# Patient Record
Sex: Female | Born: 1965 | Race: White | Hispanic: Yes | Marital: Married | Smoking: Never smoker
Health system: Southern US, Community
[De-identification: ages and names within clinical notes are randomized; demographics above are authoritative.]

## PROBLEM LIST (undated history)

## (undated) DIAGNOSIS — I959 Hypotension, unspecified: Secondary | ICD-10-CM

## (undated) DIAGNOSIS — R102 Pelvic and perineal pain: Secondary | ICD-10-CM

## (undated) DIAGNOSIS — R1084 Generalized abdominal pain: Secondary | ICD-10-CM

## (undated) DIAGNOSIS — N950 Postmenopausal bleeding: Secondary | ICD-10-CM

## (undated) DIAGNOSIS — G8929 Other chronic pain: Secondary | ICD-10-CM

## (undated) DIAGNOSIS — N3946 Mixed incontinence: Secondary | ICD-10-CM

## (undated) HISTORY — PX: COLOSTOMY: SHX63

## (undated) HISTORY — DX: Mixed incontinence: N39.46

## (undated) HISTORY — DX: Postmenopausal bleeding: N95.0

## (undated) HISTORY — PX: APPENDECTOMY: SHX54

## (undated) HISTORY — DX: Pelvic and perineal pain: R10.2

## (undated) HISTORY — DX: Other chronic pain: G89.29

## (undated) HISTORY — DX: Generalized abdominal pain: R10.84

---

## 2002-07-31 ENCOUNTER — Emergency Department (HOSPITAL_COMMUNITY): Admission: EM | Admit: 2002-07-31 | Discharge: 2002-08-01 | Payer: Self-pay | Admitting: Emergency Medicine

## 2003-11-16 ENCOUNTER — Other Ambulatory Visit: Admission: RE | Admit: 2003-11-16 | Discharge: 2003-11-16 | Payer: Self-pay | Admitting: Gynecology

## 2004-02-02 ENCOUNTER — Ambulatory Visit (HOSPITAL_COMMUNITY): Admission: RE | Admit: 2004-02-02 | Discharge: 2004-02-02 | Payer: Self-pay | Admitting: Gynecology

## 2004-03-23 ENCOUNTER — Ambulatory Visit (HOSPITAL_COMMUNITY): Admission: RE | Admit: 2004-03-23 | Discharge: 2004-03-23 | Payer: Self-pay | Admitting: Gynecology

## 2004-04-20 ENCOUNTER — Encounter (INDEPENDENT_AMBULATORY_CARE_PROVIDER_SITE_OTHER): Payer: Self-pay | Admitting: Specialist

## 2004-04-20 ENCOUNTER — Observation Stay (HOSPITAL_COMMUNITY): Admission: RE | Admit: 2004-04-20 | Discharge: 2004-04-21 | Payer: Self-pay | Admitting: Gynecology

## 2005-04-05 ENCOUNTER — Other Ambulatory Visit: Admission: RE | Admit: 2005-04-05 | Discharge: 2005-04-05 | Payer: Self-pay | Admitting: Gynecology

## 2006-09-18 ENCOUNTER — Other Ambulatory Visit: Admission: RE | Admit: 2006-09-18 | Discharge: 2006-09-18 | Payer: Self-pay | Admitting: Gynecology

## 2006-10-12 IMAGING — US US TRANSVAGINAL NON-OB
1 series · 13 of 25 positions shown · non-contrast
Comparison: none

CLINICAL DATA: Follow-up ovarian cyst.  Painful intercourse.  LMP 03/11/04.
ENDOVAGINAL PELVIC ULTRASOUND:
Multiple images of the uterus and adnexa were obtained using an endovaginal approaches.  Correlation is made with the previous exam on 02/02/04.
The uterus has a maximal sagittal length of 9.0 cm and an AP width of 4.1 cm.  The uterine myometrium demonstrates a posterior upper segment fibroid measuring 1.8 x 1.3 x 1.8 cm.  This has a small partial subserosal component.
The endometrial canal is trilayered with an AP width of 9.3 mm and would correlate with a periovulatory endometrial stripe and the patient?s LMP of 03/11/04.  
Both ovaries are seen with the right ovary today measuring 5.6 x 3.4 x 4.4 cm.  This contains a persist unilocular complex cyst measuring 3.6 x 3.1 x 3.6 cm.  This contains diffuse low-level echoes and demonstrates little interval change from the previous exam.  Findings are again most compatible with an endometrioma and lack of interval change over the six week period would correlate with this representing an endometrioma rather than a hemorrhagic cyst as we would have expected to see some interval change in appearance over this six week period of time.  
The left ovary measures 3.5 x 4.7 x 2.4 cm and contains a dominant follicle.  On today?s exam the 2 cm dominant follicle is located in a different position than the 2 cm cyst seen on the prior exam.  The change in location, as well as the size and periovulatory status would correlate with this representing a dominant follicle.  A small amount of fluid is identified within the endocervical canal.  No cul-de-sac or periovarian fluid is seen and no separate adnexal masses are noted.

[Series 1: us transvaginal non-ob · 0.19mm/px · 42 acquisitions, 13 frames shown]
[im 1/42]
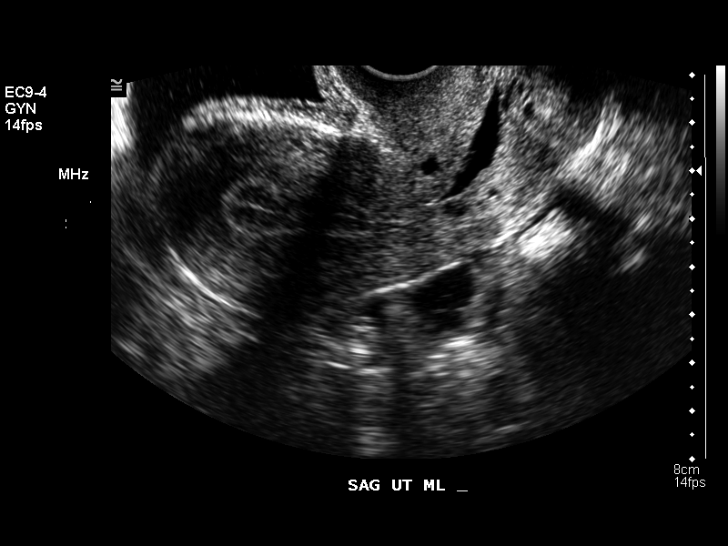
[im 4/42]
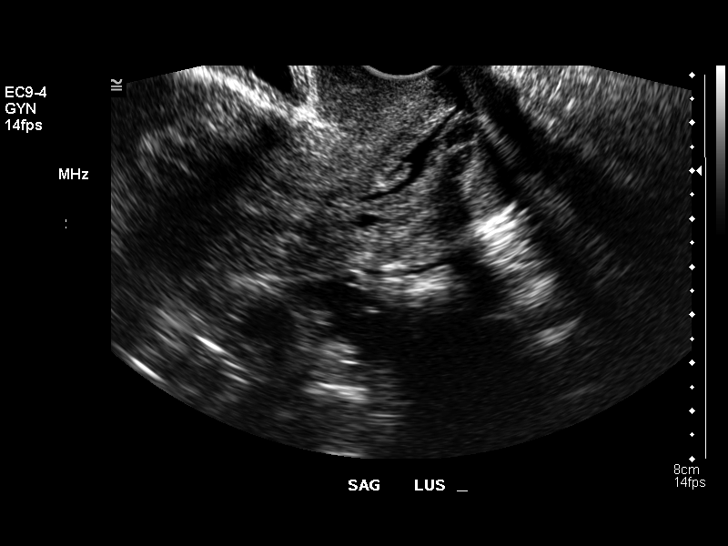
[im 7/42]
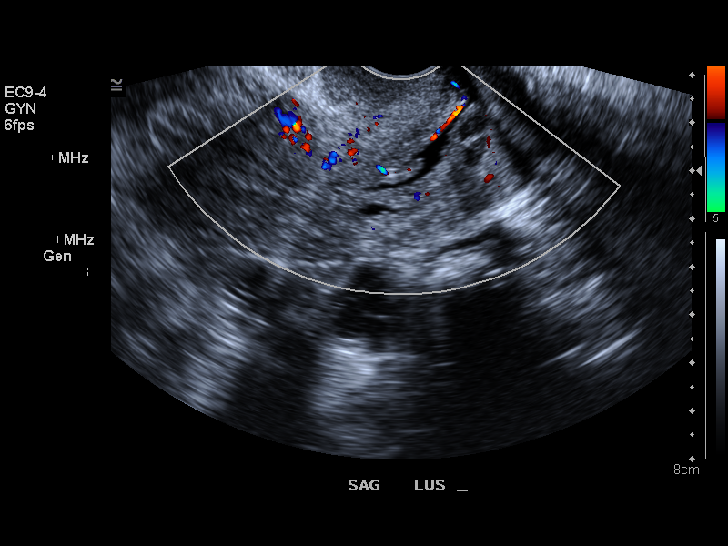
[im 11/42]
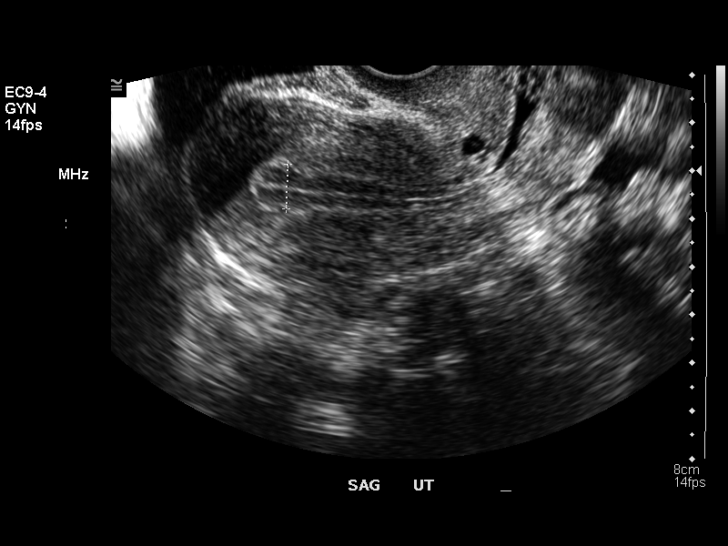
[im 14/42]
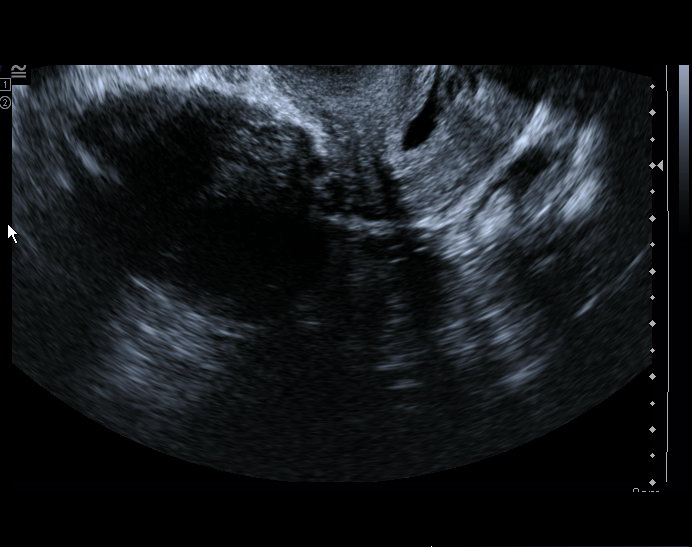
[im 18/42]
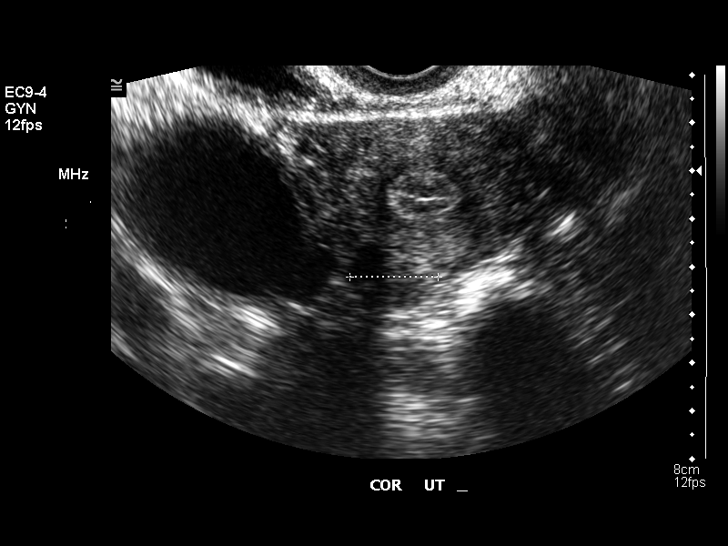
[im 21/42]
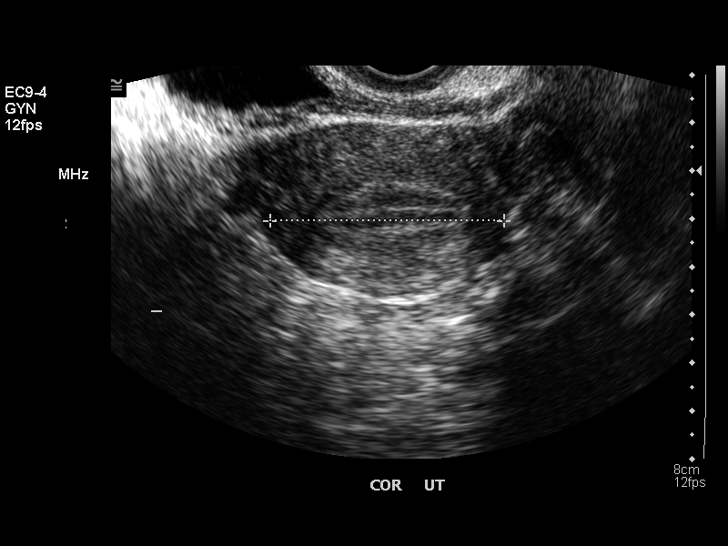
[im 24/42]
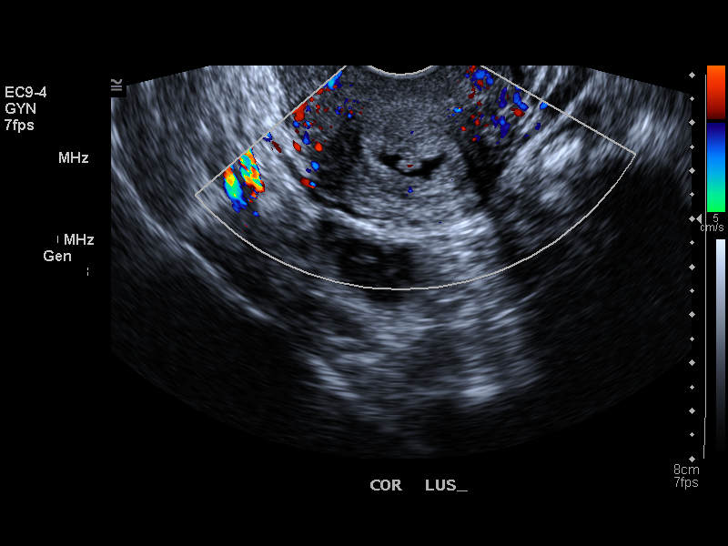
[im 28/42]
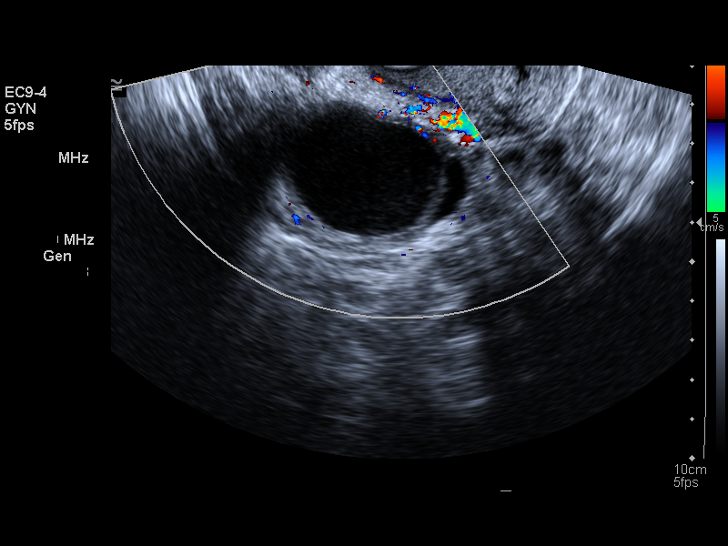
[im 31/42]
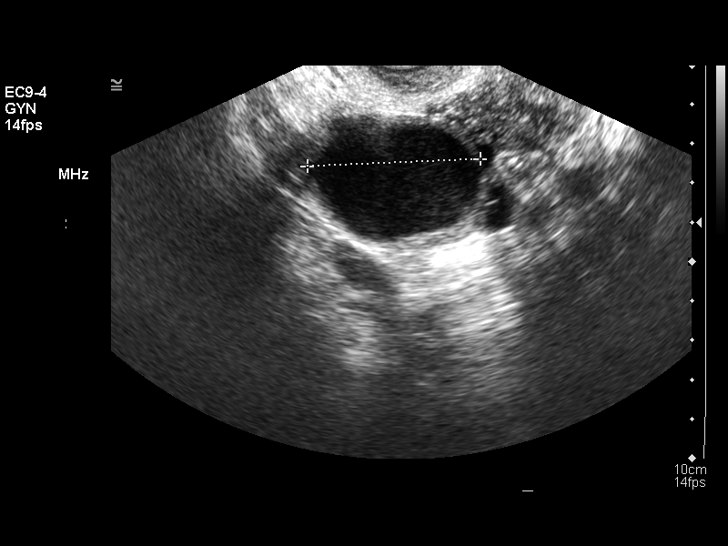
[im 35/42]
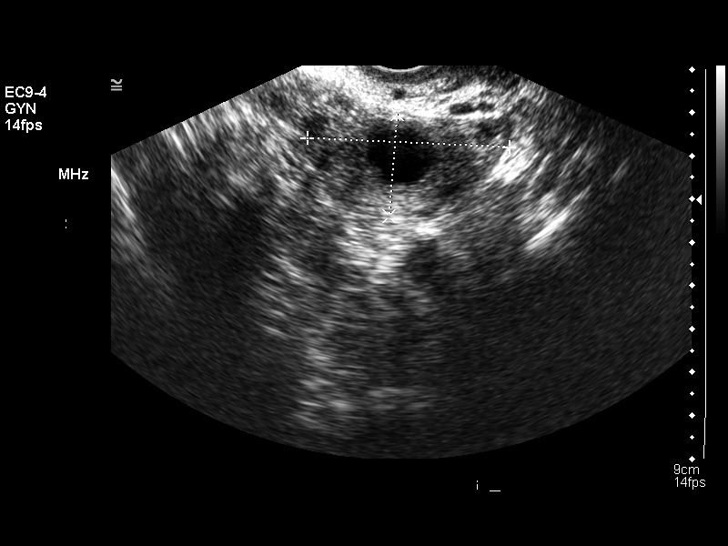
[im 38/42]
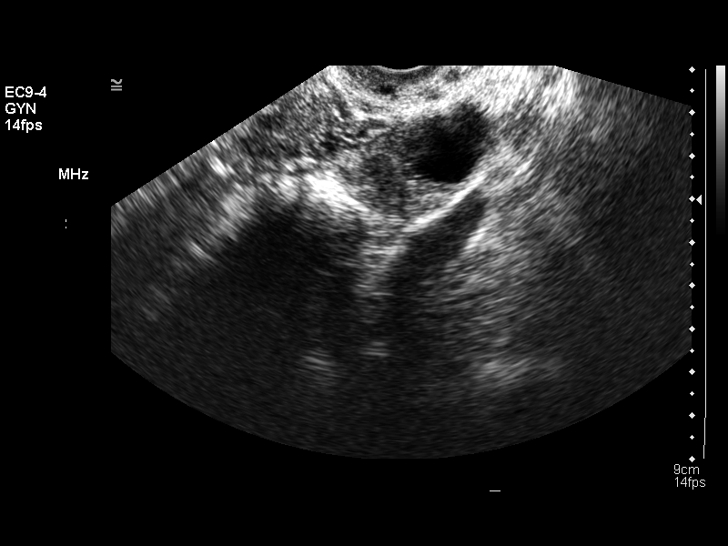
[im 42/42]
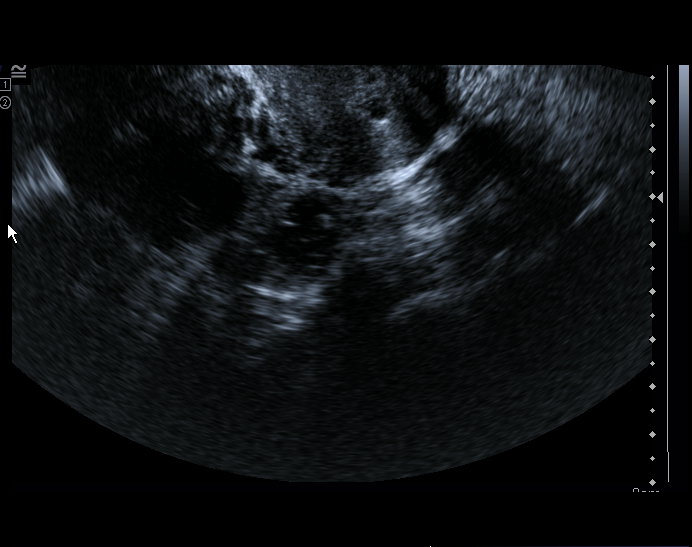

[13 of 25 positions shown; findings below may reference images not displayed]

IMPRESSION: Stable small uterine fibroids.  Stable right complex ovarian cyst compatible with an endometrioma.  Otherwise, normal periovulatory appearance to the uterus and left ovary.

## 2008-02-21 ENCOUNTER — Emergency Department (HOSPITAL_COMMUNITY): Admission: EM | Admit: 2008-02-21 | Discharge: 2008-02-21 | Payer: Self-pay | Admitting: Emergency Medicine

## 2008-02-29 ENCOUNTER — Emergency Department (HOSPITAL_COMMUNITY): Admission: EM | Admit: 2008-02-29 | Discharge: 2008-03-01 | Payer: Self-pay | Admitting: Emergency Medicine
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
## 2010-12-07 LAB — POCT I-STAT, CHEM 8
BUN: 17 mg/dL (ref 6–23)
Calcium, Ion: 0.97 mmol/L — ABNORMAL LOW (ref 1.12–1.32)
Chloride: 109 mEq/L (ref 96–112)
Creatinine, Ser: 0.8 mg/dL (ref 0.4–1.2)
Glucose, Bld: 110 mg/dL — ABNORMAL HIGH (ref 70–99)
HCT: 37 % (ref 36.0–46.0)
Hemoglobin: 12.6 g/dL (ref 12.0–15.0)
Potassium: 3.7 mEq/L (ref 3.5–5.1)
Sodium: 139 mEq/L (ref 135–145)
TCO2: 20 mmol/L (ref 0–100)

## 2010-12-07 LAB — DIFFERENTIAL
Basophils Absolute: 0 10*3/uL (ref 0.0–0.1)
Basophils Relative: 0 % (ref 0–1)
Eosinophils Absolute: 0 10*3/uL (ref 0.0–0.7)
Eosinophils Relative: 0 % (ref 0–5)
Lymphocytes Relative: 4 % — ABNORMAL LOW (ref 12–46)
Lymphs Abs: 0.4 10*3/uL — ABNORMAL LOW (ref 0.7–4.0)
Monocytes Absolute: 0.6 10*3/uL (ref 0.1–1.0)
Monocytes Relative: 6 % (ref 3–12)
Neutro Abs: 9.6 10*3/uL — ABNORMAL HIGH (ref 1.7–7.7)
Neutrophils Relative %: 90 % — ABNORMAL HIGH (ref 43–77)

## 2010-12-07 LAB — HEPATIC FUNCTION PANEL
ALT: 16 U/L (ref 0–35)
AST: 26 U/L (ref 0–37)
Albumin: 3.8 g/dL (ref 3.5–5.2)
Alkaline Phosphatase: 51 U/L (ref 39–117)
Bilirubin, Direct: 0.2 mg/dL (ref 0.0–0.3)
Indirect Bilirubin: 0.5 mg/dL (ref 0.3–0.9)
Total Bilirubin: 0.7 mg/dL (ref 0.3–1.2)
Total Protein: 6.6 g/dL (ref 6.0–8.3)

## 2010-12-07 LAB — CBC
HCT: 35.4 % — ABNORMAL LOW (ref 36.0–46.0)
Hemoglobin: 12.1 g/dL (ref 12.0–15.0)
MCHC: 34.1 g/dL (ref 30.0–36.0)
MCV: 86.3 fL (ref 78.0–100.0)
Platelets: 232 10*3/uL (ref 150–400)
RBC: 4.11 MIL/uL (ref 3.87–5.11)
RDW: 12.8 % (ref 11.5–15.5)
WBC: 10.7 10*3/uL — ABNORMAL HIGH (ref 4.0–10.5)

## 2010-12-07 LAB — URINE CULTURE

## 2010-12-07 LAB — URINALYSIS, ROUTINE W REFLEX MICROSCOPIC
Bilirubin Urine: NEGATIVE
Glucose, UA: NEGATIVE mg/dL
Hgb urine dipstick: NEGATIVE
Ketones, ur: >80 mg/dL — AB
Nitrite: NEGATIVE
Protein, ur: 30 mg/dL — AB
Specific Gravity, Urine: 1.031 — ABNORMAL HIGH (ref 1.005–1.030)
Urobilinogen, UA: 0.2 mg/dL (ref 0.0–1.0)
pH: 6.5 (ref 5.0–8.0)

## 2010-12-07 LAB — URINE MICROSCOPIC-ADD ON

## 2010-12-07 LAB — POCT PREGNANCY, URINE: Preg Test, Ur: NEGATIVE

## 2010-12-07 LAB — LIPASE, BLOOD: Lipase: 27 U/L (ref 11–59)

## 2012-11-11 ENCOUNTER — Other Ambulatory Visit: Payer: Self-pay | Admitting: Gastroenterology

## 2012-11-11 DIAGNOSIS — R109 Unspecified abdominal pain: Secondary | ICD-10-CM

## 2012-11-17 ENCOUNTER — Ambulatory Visit
Admission: RE | Admit: 2012-11-17 | Discharge: 2012-11-17 | Disposition: A | Discharge status: Home / Self Care | Payer: BC Managed Care – PPO | Source: Ambulatory Visit | Attending: Gastroenterology | Admitting: Gastroenterology

## 2012-11-17 DIAGNOSIS — R109 Unspecified abdominal pain: Secondary | ICD-10-CM

## 2013-08-09 ENCOUNTER — Emergency Department (HOSPITAL_COMMUNITY): Payer: BC Managed Care – PPO

## 2013-08-09 ENCOUNTER — Emergency Department (HOSPITAL_COMMUNITY)
Admission: EM | Admit: 2013-08-09 | Discharge: 2013-08-09 | Disposition: A | Discharge status: Home / Self Care | Payer: BC Managed Care – PPO | Attending: Emergency Medicine | Admitting: Emergency Medicine

## 2013-08-09 ENCOUNTER — Encounter (HOSPITAL_COMMUNITY): Payer: Self-pay | Admitting: Emergency Medicine

## 2013-08-09 DIAGNOSIS — R296 Repeated falls: Secondary | ICD-10-CM | POA: Insufficient documentation

## 2013-08-09 DIAGNOSIS — Z8679 Personal history of other diseases of the circulatory system: Secondary | ICD-10-CM | POA: Insufficient documentation

## 2013-08-09 DIAGNOSIS — S91309A Unspecified open wound, unspecified foot, initial encounter: Secondary | ICD-10-CM | POA: Insufficient documentation

## 2013-08-09 DIAGNOSIS — Y929 Unspecified place or not applicable: Secondary | ICD-10-CM | POA: Insufficient documentation

## 2013-08-09 DIAGNOSIS — S9030XA Contusion of unspecified foot, initial encounter: Secondary | ICD-10-CM | POA: Insufficient documentation

## 2013-08-09 DIAGNOSIS — S9032XA Contusion of left foot, initial encounter: Secondary | ICD-10-CM

## 2013-08-09 DIAGNOSIS — IMO0002 Reserved for concepts with insufficient information to code with codable children: Secondary | ICD-10-CM | POA: Insufficient documentation

## 2013-08-09 DIAGNOSIS — S90812A Abrasion, left foot, initial encounter: Secondary | ICD-10-CM

## 2013-08-09 DIAGNOSIS — S90122A Contusion of left lesser toe(s) without damage to nail, initial encounter: Secondary | ICD-10-CM

## 2013-08-09 DIAGNOSIS — Y9389 Activity, other specified: Secondary | ICD-10-CM | POA: Insufficient documentation

## 2013-08-09 HISTORY — DX: Hypotension, unspecified: I95.9

## 2013-08-09 MED ORDER — HYDROCODONE-ACETAMINOPHEN 5-325 MG PO TABS: 1.0000 | ORAL_TABLET | Freq: Four times a day (QID) | ORAL | Status: DC | PRN

## 2013-08-09 MED ORDER — BACITRACIN 500 UNIT/GM EX OINT
1.0000 "application " | TOPICAL_OINTMENT | Freq: Once | CUTANEOUS | Status: AC
  Administered 2013-08-09: 1 via TOPICAL
  Filled 2013-08-09: qty 0.9

## 2013-08-09 MED ORDER — IBUPROFEN 600 MG PO TABS: 600.0000 mg | ORAL_TABLET | Freq: Four times a day (QID) | ORAL | Status: DC | PRN

## 2013-08-09 MED ORDER — OXYCODONE-ACETAMINOPHEN 5-325 MG PO TABS
1.0000 | ORAL_TABLET | Freq: Once | ORAL | Status: AC
  Administered 2013-08-09: 1 via ORAL
  Filled 2013-08-09: qty 1

## 2013-08-09 NOTE — ED Notes (Signed)
Pt. tripped and fell forward this evening , reports pain at both knees , no LOC / alert and oriented / ambulatory , presents with abrasions at left plantar foot .

## 2013-08-09 NOTE — ED Provider Notes (Signed)
CSN: [id number]     Arrival date & time 08/09/13  1948 History  This chart was scribed for non-physician practitioner working with Rolland PorterMark James, MD, by Modena JanskyAlbert Thayil, ED Scribe. This patient was seen in room TR10C/TR10C and the patient's care was started at 8:39 PM   Chief Complaint  Patient presents with  . Fall   Patient is a [age] y.o. female presenting with fall. The history is provided by the patient. No language interpreter was used.  Fall This is a new problem. The current episode started 6 to 12 hours ago. The problem has not changed since onset.The symptoms are aggravated by walking. Nothing relieves the symptoms. She has tried nothing for the symptoms.   HPI Comments: Lynn Shields is a 48 y.o. female who presents to the Emergency Department complaining of a fall that occurred a few hours ago. She reports that her toe got caught on brick step. She fell forward and landed on both knees. She reports pain in both of her knees and abrasions at left plantar foot. She denies LOC or nausea. Up to date on tetanus.   Past Medical History  Diagnosis Date  . Hypotension    Past Surgical History  Procedure Laterality Date  . Colostomy    . Appendectomy     No family history on file. History  Substance Use Topics  . Smoking status: Never Smoker   . Smokeless tobacco: Not on file  . Alcohol Use: No   OB History   Grav Para Term Preterm Abortions TAB SAB Ect Mult Living                 Review of Systems  Gastrointestinal: Negative for nausea.  Musculoskeletal: Positive for arthralgias (both knees).       Abrasions to left great toe and ball of foot. Left ankle pain. Bilateral knee abrasions.  Neurological: Negative for syncope.    Allergies  Gentamycin  Home Medications   Prior to Admission medications   Medication Sig Start Date End Date Taking? Authorizing Provider  HYDROcodone-acetaminophen (NORCO) 5-325 MG per tablet Take 1 tablet by mouth every 6 (six) hours as needed  for moderate pain. 08/09/13   Alaney Witter Orlene OchM Gitty Osterlund, NP  ibuprofen (ADVIL,MOTRIN) 600 MG tablet Take 1 tablet (600 mg total) by mouth every 6 (six) hours as needed. 08/09/13   [place]ington Krotz Orlene OchM Laurice Kimmons, NP   BP 111/71  Pulse 103  Temp(Src) 98.6 F (37 C) (Oral)  Resp 14  SpO2 98%  LMP 07/26/2013 Physical Exam  Nursing note and vitals reviewed. Constitutional: She is oriented to person, place, and time. She appears well-developed and well-nourished.  HENT:  Head: Normocephalic and atraumatic.  Eyes: Conjunctivae and EOM are normal.  Neck: Normal range of motion. Neck supple.  Cardiovascular: Normal rate.   Pulmonary/Chest: Effort normal.  Musculoskeletal: Normal range of motion.       Right knee: She exhibits normal range of motion, no swelling, no ecchymosis, no deformity, no erythema and normal alignment. Tenderness found.       Left knee: She exhibits normal range of motion, no swelling, no ecchymosis, no erythema and normal patellar mobility. Tenderness found.       Legs:      Left foot: She exhibits swelling. She exhibits no deformity.       Feet:  Left ankle on lateral axis is swollen and TTP. Pain with dorsiflexion and plantar flexion. Pain with eversion. Pulses strong, adequate circulation, good touch sensation. Avulsion laceration to  tip of great toe and the ball of the foot. No active bleeding.   Neurological: She is alert and oriented to person, place, and time. No cranial nerve deficit.  Skin: Skin is warm and dry.  Abrasions to knees and left foot.  Psychiatric: She has a normal mood and affect. Her behavior is normal.    ED Course  Procedures (including critical care time) DIAGNOSTIC STUDIES: Oxygen Saturation is 98% on RA, normal by my interpretation.    COORDINATION OF CARE: 8:43 PM- Pt advised of plan for treatment which includes medication and radiology and pt agrees.  Labs ReviewImaging Review Dg Ankle Complete Left  08/09/2013   CLINICAL DATA:  Pain post trauma  EXAM: LEFT ANKLE  COMPLETE - 3+ VIEW  COMPARISON:  None.  FINDINGS: Frontal, oblique, and lateral views were obtained. There is no fracture or effusion. Ankle mortise appears intact.  IMPRESSION: No fracture.  Mortise intact.   Electronically Signed   By: Bretta Bang M.D.   On: 08/09/2013 21:45   Dg Foot Complete Left  08/09/2013   CLINICAL DATA:  Anterior foot pain after a fall today.  EXAM: LEFT FOOT - COMPLETE 3+ VIEW  COMPARISON:  None.  FINDINGS: Mild hallux valgus deformity with degenerative changes at the first metatarsal-phalangeal joint. Slight widening of the joint space between the medial and middle cuneiform bones could indicate ligamentous injury. No acute fracture is demonstrated. No significant soft tissue swelling.  IMPRESSION: Slight widening of the intertarsal space between the medial and middle cuneiform bones could indicate ligamentous injury or normal variation. No displaced fractures identified. Mild hallux valgus.   Electronically Signed   By: Burman Nieves M.D.   On: 08/09/2013 21:45     MDM  48 y.o. female with contusions, abrasions and superficial lacerations to the left foot. Contusions and abrasions bilateral knees. Wounds cleaned, bacitracin ointment, buddy tap of toes, left, ace wrap, post-op shoe. She will elevate, ice and take the pain medication as directed.  She will follow up with ortho if symptoms persist. I have reviewed this patient's vital signs, nurses notes, appropriate labs and imaging.  I have discussed findings and plan of care with the patient and she agrees with plan.    Medication List         HYDROcodone-acetaminophen 5-325 MG per tablet  Commonly known as:  NORCO  Take 1 tablet by mouth every 6 (six) hours as needed for moderate pain.     ibuprofen 600 MG tablet  Commonly known as:  ADVIL,MOTRIN  Take 1 tablet (600 mg total) by mouth every 6 (six) hours as needed.       I personally performed the services described in this documentation, which was scribed  in my presence. The recorded information has been reviewed and is accurate.     [facility] Orlene Och, NP 08/09/13 2300

## 2013-08-09 NOTE — Discharge Instructions (Signed)
Clean the abrasions with warm water and antibacterial soap. Apply neosporin ointment and dressing. Wear the ace wrap and shoe for comfort. Return for any problems.   Abrasion An abrasion is a cut or scrape of the skin. Abrasions do not extend through all layers of the skin and most heal within 10 days. It is important to care for your abrasion properly to prevent infection. CAUSES  Most abrasions are caused by falling on, or gliding across, the ground or other surface. When your skin rubs on something, the outer and inner layer of skin rubs off, causing an abrasion. DIAGNOSIS  Your caregiver will be able to diagnose an abrasion during a physical exam.  TREATMENT  Your treatment depends on how large and deep the abrasion is. Generally, your abrasion will be cleaned with water and a mild soap to remove any dirt or debris. An antibiotic ointment may be put over the abrasion to prevent an infection. A bandage (dressing) may be wrapped around the abrasion to keep it from getting dirty.  You may need a tetanus shot if:  You cannot remember when you had your last tetanus shot.  You have never had a tetanus shot.  The injury broke your skin. If you get a tetanus shot, your arm may swell, get red, and feel warm to the touch. This is common and not a problem. If you need a tetanus shot and you choose not to have one, there is a rare chance of getting tetanus. Sickness from tetanus can be serious.  HOME CARE INSTRUCTIONS   If a dressing was applied, change it at least once a day or as directed by your caregiver. If the bandage sticks, soak it off with warm water.   Wash the area with water and a mild soap to remove all the ointment 2 times a day. Rinse off the soap and pat the area dry with a clean towel.   Reapply any ointment as directed by your caregiver. This will help prevent infection and keep the bandage from sticking. Use gauze over the wound and under the dressing to help keep the bandage  from sticking.   Change your dressing right away if it becomes wet or dirty.   Only take over-the-counter or prescription medicines for pain, discomfort, or fever as directed by your caregiver.   Follow up with your caregiver within 24 48 hours for a wound check, or as directed. If you were not given a wound-check appointment, look closely at your abrasion for redness, swelling, or pus. These are signs of infection. SEEK IMMEDIATE MEDICAL CARE IF:   You have increasing pain in the wound.   You have redness, swelling, or tenderness around the wound.   You have pus coming from the wound.   You have a fever or persistent symptoms for more than 2 3 days.  You have a fever and your symptoms suddenly get worse.  You have a bad smell coming from the wound or dressing.  MAKE SURE YOU:   Understand these instructions.  Will watch your condition.  Will get help right away if you are not doing well or get worse. Document Released: 11/29/2004 Document Revised: 02/06/2012 Document Reviewed: 01/23/2011 [facility] Patient Information 2014 [place], [place].  Buddy Taping of Toes We have taped your toes together to keep them from moving. This is called "buddy taping" since we used a part of your own body to keep the injured part still. We placed soft padding between your toes to keep  them from rubbing against each other. Buddy taping will help with healing and to reduce pain. Keep your toes buddy taped together for as long as directed by your caregiver. HOME CARE INSTRUCTIONS   Raise your injured area above the level of your heart while sitting or lying down. Prop it up with pillows.  An ice pack used every twenty minutes, while awake, for the first one to two days may be helpful. Put ice in a plastic bag and put a towel between the bag and your skin.  Watch for signs that the taping is too tight. These signs may be:  Numbness of your taped toes.  Coolness of your taped toes.  Color  change in the area beyond the tape.  Increased pain.  If you have any of these signs, loosen or rewrap the tape. If you need to loosen or rewrap the buddy tape, make sure you use the padding again. SEEK IMMEDIATE MEDICAL CARE IF:   You have worse pain, swelling, inflammation (soreness), drainage or bleeding after you rewrap the tape.  Any new problems occur. MAKE SURE YOU:   Understand these instructions.  Will watch your condition.  Will get help right away if you are not doing well or get worse. Document Released: 11/24/2003 Document Revised: 05/14/2011 Document Reviewed: 02/17/2008 [facility] Patient Information 2014 [place], [place].  Contusion A contusion is a deep bruise. Contusions are the result of an injury that caused bleeding under the skin. The contusion may turn blue, purple, or yellow. Minor injuries will give you a painless contusion, but more severe contusions may stay painful and swollen for a few weeks.  CAUSES  A contusion is usually caused by a blow, trauma, or direct force to an area of the body. SYMPTOMS   Swelling and redness of the injured area.  Bruising of the injured area.  Tenderness and soreness of the injured area.  Pain. DIAGNOSIS  The diagnosis can be made by taking a history and physical exam. An X-ray, CT scan, or MRI may be needed to determine if there were any associated injuries, such as fractures. TREATMENT  Specific treatment will depend on what area of the body was injured. In general, the best treatment for a contusion is resting, icing, elevating, and applying cold compresses to the injured area. Over-the-counter medicines may also be recommended for pain control. Ask your caregiver what the best treatment is for your contusion. HOME CARE INSTRUCTIONS   Put ice on the injured area.  Put ice in a plastic bag.  Place a towel between your skin and the bag.  Leave the ice on for 15-20 minutes, 03-04 times a day.  Only take  over-the-counter or prescription medicines for pain, discomfort, or fever as directed by your caregiver. Your caregiver may recommend avoiding anti-inflammatory medicines (aspirin, ibuprofen, and naproxen) for 48 hours because these medicines may increase bruising.  Rest the injured area.  If possible, elevate the injured area to reduce swelling. SEEK IMMEDIATE MEDICAL CARE IF:   You have increased bruising or swelling.  You have pain that is getting worse.  Your swelling or pain is not relieved with medicines. MAKE SURE YOU:   Understand these instructions.  Will watch your condition.  Will get help right away if you are not doing well or get worse. Document Released: 11/29/2004 Document Revised: 05/14/2011 Document Reviewed: 12/25/2010 [facility] Patient Information 2014 [place], [place].

## 2013-08-20 NOTE — ED Provider Notes (Signed)
Medical screening examination/treatment/procedure(s) were performed by non-physician practitioner and as supervising physician I was immediately available for consultation/collaboration.   EKG Interpretation None        Rolland PorterMark Karon Cotterill, MD 08/20/13 0021

## 2015-02-03 ENCOUNTER — Encounter: Payer: BLUE CROSS/BLUE SHIELD | Admitting: Obstetrics & Gynecology

## 2015-02-03 ENCOUNTER — Ambulatory Visit (INDEPENDENT_AMBULATORY_CARE_PROVIDER_SITE_OTHER): Payer: BLUE CROSS/BLUE SHIELD | Admitting: *Deleted

## 2015-02-03 DIAGNOSIS — R300 Dysuria: Secondary | ICD-10-CM | POA: Diagnosis not present

## 2015-02-03 LAB — POCT URINALYSIS DIP (DEVICE)
Bilirubin Urine: NEGATIVE
GLUCOSE, UA: NEGATIVE mg/dL
KETONES UR: NEGATIVE mg/dL
Nitrite: NEGATIVE
PROTEIN: NEGATIVE mg/dL
SPECIFIC GRAVITY, URINE: 1.02 (ref 1.005–1.030)
Urobilinogen, UA: 0.2 mg/dL (ref 0.0–1.0)
pH: 5 (ref 5.0–8.0)

## 2015-02-03 NOTE — Progress Notes (Signed)
Patient c/o pain. States does not have burning or pain with urination, but has burning pain at other times. States has been occuring for the past week.Thinks she has a uti.  I informed her we would do a urinalysis. Urinalysis showed small amount of blood and trace leukocytes. Patient denies any bleeding. I informed her I would send urine for culture- if it comes back that she has a uti - we will call her next week and tell her which prescription we are sending to her pharmacy. If it does not show a uti-and she has symptoms she can make an appointment to see a provider. She voices understanding.  I also reviewed pt complaints and plan of care with Dr. Debroah LoopArnold- approved plan.

## 2015-02-05 LAB — URINE CULTURE: Colony Count: >=100000

## 2015-02-09 ENCOUNTER — Telehealth: Payer: Self-pay

## 2015-02-09 DIAGNOSIS — N390 Urinary tract infection, site not specified: Secondary | ICD-10-CM

## 2015-02-09 NOTE — Telephone Encounter (Signed)
Per Dr. Debroah LoopArnold, pt has UTI and needs antibiotic therapy.  Keflex 500 mg po tid x 5 days.  Medication not ordered due to not having pt's pharmacy.  Called pt and unable to LM due to VM box filled.

## 2015-02-10 ENCOUNTER — Encounter: Payer: Self-pay | Admitting: *Deleted

## 2015-02-10 MED ORDER — CEPHALEXIN 500 MG PO CAPS: 500.0000 mg | ORAL_CAPSULE | Freq: Three times a day (TID) | ORAL | Status: DC

## 2015-02-10 NOTE — Telephone Encounter (Signed)
Letter with written rx sent to patient via certified mail.

## 2015-02-24 ENCOUNTER — Encounter: Payer: Self-pay | Admitting: Certified Nurse Midwife

## 2015-02-24 ENCOUNTER — Ambulatory Visit (INDEPENDENT_AMBULATORY_CARE_PROVIDER_SITE_OTHER): Payer: BLUE CROSS/BLUE SHIELD | Admitting: Certified Nurse Midwife

## 2015-02-24 VITALS — BP 111/68 | HR 64 | Temp 98.3°F | Ht 68.0 in | Wt 169.2 lb

## 2015-02-24 DIAGNOSIS — N951 Menopausal and female climacteric states: Secondary | ICD-10-CM | POA: Diagnosis not present

## 2015-02-24 NOTE — Addendum Note (Signed)
Addended by: Garret ReddishBARNES, Briselda Naval M on: 02/24/2015 04:19 PM   Modules accepted: Orders

## 2015-02-24 NOTE — Progress Notes (Signed)
Patient ID: Lynn RubensteinGuadalupe A Froelich, female   DOB: 10-16-1965, 49 y.o.   MRN: [id number] CC: Vaginal Bleeding and Dyspareunia    None     HPI Lynn RubensteinGuadalupe A Seats is a [age] y.o. No obstetric history on file. who presents with onset  LMP 02/19/15.She reports having a normal period 8 months ago, expereincing hot flashes, vaginal dryness  Past Medical History  Diagnosis Date  . Hypotension     OB History  No data available    Past Surgical History  Procedure Laterality Date  . Colostomy    . Appendectomy      Social History   Social History  . Marital Status: Married    Spouse Name: N/A  . Number of Children: N/A  . Years of Education: N/A   Occupational History  . Not on file.   Social History Main Topics  . Smoking status: Never Smoker   . Smokeless tobacco: Not on file  . Alcohol Use: No  . Drug Use: No  . Sexual Activity: Not on file   Other Topics Concern  . Not on file   Social History Narrative    Current Outpatient Prescriptions on File Prior to Visit  Medication Sig Dispense Refill  . cephALEXin (KEFLEX) 500 MG capsule Take 1 capsule (500 mg total) by mouth 3 (three) times daily. (Patient not taking: Reported on 02/24/2015) 15 capsule 0  . HYDROcodone-acetaminophen (NORCO) 5-325 MG per tablet Take 1 tablet by mouth every 6 (six) hours as needed for moderate pain. (Patient not taking: Reported on 02/24/2015) 30 tablet 0  . ibuprofen (ADVIL,MOTRIN) 600 MG tablet Take 1 tablet (600 mg total) by mouth every 6 (six) hours as needed. (Patient not taking: Reported on 02/24/2015) 30 tablet 0   No current facility-administered medications on file prior to visit.    Allergies  Allergen Reactions  . Gentamycin [Gentamicin]   . Latex Itching     ROS Pertinent items in HPI   PHYSICAL EXAM Filed Vitals:   02/24/15 1541  BP: 111/68  Pulse: 64  Temp: 98.3 F (36.8 C)   General: Well nourished, well developed female in no acute distress Cardiovascular:  Normal rate Respiratory: Normal effort Abdomen: Soft, nontender Back: No CVAT Extremities: No edema Neurologic:grossly intact Speculum exam: NEFG; vagina with physiologic discharge, no blood; cervix clean Bimanual exam: cervix closed, no CMT; uterus NSSP; no adnexal tenderness or masses   LAB RESULTS  IMAGING No results found.      ASSESSMENT  1. Perimenopausal symptoms   Reassurred; explained that true menopause is defined by not having a period for a year  [facility]    Medication List       This list is accurate as of: 02/24/15  4:02 PM.  Always use your most recent med list.               cephALEXin 500 MG capsule  Commonly known as:  KEFLEX  Take 1 capsule (500 mg total) by mouth 3 (three) times daily.     HYDROcodone-acetaminophen 5-325 MG tablet  Commonly known as:  NORCO  Take 1 tablet by mouth every 6 (six) hours as needed for moderate pain.     ibuprofen 600 MG tablet  Commonly known as:  ADVIL,MOTRIN  Take 1 tablet (600 mg total) by mouth every 6 (six) hours as needed.          Rhea PinkLori A Iziah Cates, CNM 02/24/2015 4:02 PM

## 2015-02-24 NOTE — Patient Instructions (Signed)

## 2015-03-14 ENCOUNTER — Telehealth: Payer: Self-pay

## 2015-03-14 NOTE — Telephone Encounter (Signed)
Pt called stating that wife was never called concerning lab results. According to chart we attempted to reach patients x 2 and then sent a certified letter with the rx so that patient can take to the pharmacy.

## 2015-12-09 ENCOUNTER — Encounter: Payer: Self-pay | Admitting: Nurse Practitioner

## 2015-12-09 ENCOUNTER — Ambulatory Visit: Payer: BLUE CROSS/BLUE SHIELD | Admitting: Nurse Practitioner

## 2015-12-09 DIAGNOSIS — N950 Postmenopausal bleeding: Secondary | ICD-10-CM | POA: Insufficient documentation

## 2015-12-09 DIAGNOSIS — R1084 Generalized abdominal pain: Secondary | ICD-10-CM | POA: Insufficient documentation

## 2015-12-09 DIAGNOSIS — G8929 Other chronic pain: Secondary | ICD-10-CM

## 2015-12-09 DIAGNOSIS — R102 Pelvic and perineal pain: Secondary | ICD-10-CM

## 2015-12-09 DIAGNOSIS — N3946 Mixed incontinence: Secondary | ICD-10-CM | POA: Insufficient documentation

## 2015-12-09 HISTORY — DX: Other chronic pain: G89.29

## 2016-01-09 ENCOUNTER — Other Ambulatory Visit: Payer: Self-pay | Admitting: *Deleted

## 2016-01-09 ENCOUNTER — Ambulatory Visit: Payer: BLUE CROSS/BLUE SHIELD | Admitting: Nurse Practitioner

## 2016-07-21 ENCOUNTER — Encounter (HOSPITAL_COMMUNITY): Payer: Self-pay

## 2016-07-21 ENCOUNTER — Emergency Department (HOSPITAL_COMMUNITY)
Admission: EM | Admit: 2016-07-21 | Discharge: 2016-07-21 | Disposition: A | Discharge status: Home / Self Care | Payer: Managed Care, Other (non HMO) | Attending: Emergency Medicine | Admitting: Emergency Medicine

## 2016-07-21 DIAGNOSIS — M62838 Other muscle spasm: Secondary | ICD-10-CM | POA: Diagnosis not present

## 2016-07-21 DIAGNOSIS — Z91040 Latex allergy status: Secondary | ICD-10-CM | POA: Diagnosis not present

## 2016-07-21 DIAGNOSIS — Z79899 Other long term (current) drug therapy: Secondary | ICD-10-CM | POA: Diagnosis not present

## 2016-07-21 DIAGNOSIS — M542 Cervicalgia: Secondary | ICD-10-CM | POA: Diagnosis present

## 2016-07-21 MED ORDER — DIAZEPAM 5 MG PO TABS
5.0000 mg | ORAL_TABLET | Freq: Once | ORAL | Status: AC
  Administered 2016-07-21: 5 mg via ORAL
  Filled 2016-07-21: qty 1

## 2016-07-21 MED ORDER — KETOROLAC TROMETHAMINE 15 MG/ML IJ SOLN
15.0000 mg | Freq: Once | INTRAMUSCULAR | Status: AC
  Administered 2016-07-21: 15 mg via INTRAVENOUS
  Filled 2016-07-21: qty 1

## 2016-07-21 MED ORDER — NAPROXEN 500 MG PO TABS: 500.0000 mg | ORAL_TABLET | Freq: Two times a day (BID) | ORAL | 0 refills | Status: AC

## 2016-07-21 MED ORDER — HYDROMORPHONE HCL 1 MG/ML IJ SOLN
0.5000 mg | Freq: Once | INTRAMUSCULAR | Status: AC
  Administered 2016-07-21: 0.5 mg via INTRAVENOUS
  Filled 2016-07-21: qty 1

## 2016-07-21 MED ORDER — HYDROCODONE-ACETAMINOPHEN 5-325 MG PO TABS: ORAL_TABLET | ORAL | 0 refills | Status: AC

## 2016-07-21 MED ORDER — METHOCARBAMOL 500 MG PO TABS: 1000.0000 mg | ORAL_TABLET | Freq: Four times a day (QID) | ORAL | 0 refills | Status: AC
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
## 2016-07-21 NOTE — ED Notes (Signed)
20 removed from rt hand; catheter intact.

## 2016-07-21 NOTE — ED Notes (Signed)
Pt states that her arms feel numb in the AM and hands are swollen and requests to know what is going on. EDP made aware.

## 2016-07-21 NOTE — Discharge Instructions (Signed)
Please read and follow all provided instructions.  Your diagnoses today include:  1. Muscle spasms of neck     Tests performed today include:  Vital signs - see below for your results today  Medications prescribed:   Naproxen - anti-inflammatory pain medication  Do not exceed 500mg  naproxen every 12 hours, take with food  You have been prescribed an anti-inflammatory medication or NSAID. Take with food. Take smallest effective dose for the shortest duration needed for your pain. Stop taking if you experience stomach pain or vomiting.    Robaxin (methocarbamol) - muscle relaxer medication  DO NOT drive or perform any activities that require you to be awake and alert because this medicine can make you drowsy.    Vicodin (hydrocodone/acetaminophen) - narcotic pain medication  DO NOT drive or perform any activities that require you to be awake and alert because this medicine can make you drowsy. BE VERY CAREFUL not to take multiple medicines containing Tylenol (also called acetaminophen). Doing so can lead to an overdose which can damage your liver and cause liver failure and possibly death.  Take any prescribed medications only as directed.  Home care instructions:   Follow any educational materials contained in this packet  Please rest, use ice or heat on your back for the next several days  Do not lift, push, pull anything more than 10 pounds for the next week  Follow-up instructions: Please follow-up with your primary care provider in the next few days for further evaluation of your symptoms.   Return instructions:  SEEK IMMEDIATE MEDICAL ATTENTION IF YOU HAVE:  New numbness, tingling, weakness, or problem with the use of your arms or legs  Severe back pain not relieved with medications  Loss control of your bowels or bladder  Increasing pain in any areas of the body (such as chest or abdominal pain)  Shortness of breath, dizziness, or fainting.   Worsening  nausea (feeling sick to your stomach), vomiting, fever, or sweats  Any other emergent concerns regarding your health   Additional Information:  Your vital signs today were: BP 114/72    Pulse 61    Temp 97.5 F (36.4 C) (Oral)    Resp 14    Ht 5\' 8"  (1.727 m)    Wt 170 lb (77.1 kg)    SpO2 90%    BMI 25.85 kg/m  If your blood pressure (BP) was elevated above 135/85 this visit, please have this repeated by your doctor within one month. --------------

## 2016-08-28 ENCOUNTER — Other Ambulatory Visit: Payer: Self-pay | Admitting: Orthopedic Surgery

## 2016-08-28 DIAGNOSIS — M4722 Other spondylosis with radiculopathy, cervical region: Secondary | ICD-10-CM

## 2016-09-08 ENCOUNTER — Ambulatory Visit
Admission: RE | Admit: 2016-09-08 | Discharge: 2016-09-08 | Disposition: A | Discharge status: Home / Self Care | Payer: Managed Care, Other (non HMO) | Source: Ambulatory Visit | Attending: Orthopedic Surgery | Admitting: Orthopedic Surgery

## 2016-09-08 DIAGNOSIS — M4722 Other spondylosis with radiculopathy, cervical region: Secondary | ICD-10-CM

## 2018-03-10 DIAGNOSIS — H9319 Tinnitus, unspecified ear: Secondary | ICD-10-CM | POA: Diagnosis not present

## 2018-03-10 DIAGNOSIS — J342 Deviated nasal septum: Secondary | ICD-10-CM | POA: Diagnosis not present

## 2018-03-10 DIAGNOSIS — M542 Cervicalgia: Secondary | ICD-10-CM | POA: Diagnosis not present

## 2018-03-10 DIAGNOSIS — H9209 Otalgia, unspecified ear: Secondary | ICD-10-CM | POA: Diagnosis not present

## 2018-07-21 DIAGNOSIS — Z711 Person with feared health complaint in whom no diagnosis is made: Secondary | ICD-10-CM | POA: Diagnosis not present

## 2018-07-21 DIAGNOSIS — X58XXXA Exposure to other specified factors, initial encounter: Secondary | ICD-10-CM | POA: Diagnosis not present

## 2018-07-21 DIAGNOSIS — S1011XA Abrasion of throat, initial encounter: Secondary | ICD-10-CM | POA: Diagnosis not present

## 2018-07-21 DIAGNOSIS — Z0389 Encounter for observation for other suspected diseases and conditions ruled out: Secondary | ICD-10-CM | POA: Diagnosis not present

## 2018-12-15 DIAGNOSIS — K30 Functional dyspepsia: Secondary | ICD-10-CM | POA: Diagnosis not present

## 2018-12-15 DIAGNOSIS — E785 Hyperlipidemia, unspecified: Secondary | ICD-10-CM | POA: Diagnosis not present

## 2018-12-15 DIAGNOSIS — E663 Overweight: Secondary | ICD-10-CM | POA: Diagnosis not present

## 2018-12-15 DIAGNOSIS — Z7689 Persons encountering health services in other specified circumstances: Secondary | ICD-10-CM | POA: Diagnosis not present

## 2018-12-15 DIAGNOSIS — Z0000 Encounter for general adult medical examination without abnormal findings: Secondary | ICD-10-CM | POA: Diagnosis not present

## 2018-12-29 DIAGNOSIS — N959 Unspecified menopausal and perimenopausal disorder: Secondary | ICD-10-CM | POA: Diagnosis not present

## 2018-12-29 DIAGNOSIS — K30 Functional dyspepsia: Secondary | ICD-10-CM | POA: Diagnosis not present

## 2018-12-29 DIAGNOSIS — F418 Other specified anxiety disorders: Secondary | ICD-10-CM | POA: Diagnosis not present

## 2018-12-29 DIAGNOSIS — G4700 Insomnia, unspecified: Secondary | ICD-10-CM | POA: Diagnosis not present

## 2018-12-29 DIAGNOSIS — Z1331 Encounter for screening for depression: Secondary | ICD-10-CM | POA: Diagnosis not present

## 2019-02-03 DIAGNOSIS — M542 Cervicalgia: Secondary | ICD-10-CM | POA: Diagnosis not present

## 2019-02-03 DIAGNOSIS — N959 Unspecified menopausal and perimenopausal disorder: Secondary | ICD-10-CM | POA: Diagnosis not present

## 2019-02-03 DIAGNOSIS — K30 Functional dyspepsia: Secondary | ICD-10-CM | POA: Diagnosis not present

## 2019-02-03 DIAGNOSIS — G4700 Insomnia, unspecified: Secondary | ICD-10-CM | POA: Diagnosis not present

## 2019-06-24 ENCOUNTER — Other Ambulatory Visit: Payer: Self-pay

## 2019-06-24 DIAGNOSIS — M542 Cervicalgia: Secondary | ICD-10-CM

## 2019-07-20 ENCOUNTER — Ambulatory Visit
Admission: RE | Admit: 2019-07-20 | Discharge: 2019-07-20 | Disposition: A | Discharge status: Home / Self Care | Payer: BC Managed Care – PPO | Source: Ambulatory Visit

## 2019-07-20 DIAGNOSIS — M542 Cervicalgia: Secondary | ICD-10-CM

## 2019-08-13 DIAGNOSIS — M25512 Pain in left shoulder: Secondary | ICD-10-CM | POA: Diagnosis not present

## 2019-08-13 DIAGNOSIS — G4700 Insomnia, unspecified: Secondary | ICD-10-CM | POA: Diagnosis not present

## 2019-08-13 DIAGNOSIS — N959 Unspecified menopausal and perimenopausal disorder: Secondary | ICD-10-CM | POA: Diagnosis not present

## 2019-08-13 DIAGNOSIS — K30 Functional dyspepsia: Secondary | ICD-10-CM | POA: Diagnosis not present

## 2019-08-28 DIAGNOSIS — N3091 Cystitis, unspecified with hematuria: Secondary | ICD-10-CM | POA: Diagnosis not present

## 2019-08-28 DIAGNOSIS — N3001 Acute cystitis with hematuria: Secondary | ICD-10-CM | POA: Diagnosis not present

## 2019-08-28 DIAGNOSIS — K529 Noninfective gastroenteritis and colitis, unspecified: Secondary | ICD-10-CM | POA: Diagnosis not present

## 2019-08-28 DIAGNOSIS — K591 Functional diarrhea: Secondary | ICD-10-CM | POA: Diagnosis not present

## 2019-08-31 ENCOUNTER — Other Ambulatory Visit: Payer: Self-pay

## 2019-08-31 DIAGNOSIS — M542 Cervicalgia: Secondary | ICD-10-CM

## 2019-10-07 ENCOUNTER — Other Ambulatory Visit: Payer: Self-pay

## 2019-10-08 ENCOUNTER — Ambulatory Visit
Admission: RE | Admit: 2019-10-08 | Discharge: 2019-10-08 | Disposition: A | Discharge status: Home / Self Care | Payer: BC Managed Care – PPO | Source: Ambulatory Visit

## 2019-10-08 DIAGNOSIS — M4802 Spinal stenosis, cervical region: Secondary | ICD-10-CM | POA: Diagnosis not present

## 2019-10-08 DIAGNOSIS — M50222 Other cervical disc displacement at C5-C6 level: Secondary | ICD-10-CM | POA: Diagnosis not present

## 2019-10-08 DIAGNOSIS — M542 Cervicalgia: Secondary | ICD-10-CM

## 2020-01-12 DIAGNOSIS — K30 Functional dyspepsia: Secondary | ICD-10-CM | POA: Diagnosis not present

## 2020-01-12 DIAGNOSIS — N959 Unspecified menopausal and perimenopausal disorder: Secondary | ICD-10-CM | POA: Diagnosis not present

## 2020-01-12 DIAGNOSIS — M79672 Pain in left foot: Secondary | ICD-10-CM | POA: Diagnosis not present

## 2020-01-12 DIAGNOSIS — M79671 Pain in right foot: Secondary | ICD-10-CM | POA: Diagnosis not present

## 2020-01-19 DIAGNOSIS — M542 Cervicalgia: Secondary | ICD-10-CM | POA: Diagnosis not present

## 2020-01-19 DIAGNOSIS — Z6826 Body mass index (BMI) 26.0-26.9, adult: Secondary | ICD-10-CM | POA: Diagnosis not present

## 2020-01-19 DIAGNOSIS — Z0000 Encounter for general adult medical examination without abnormal findings: Secondary | ICD-10-CM | POA: Diagnosis not present

## 2020-03-01 DIAGNOSIS — M7742 Metatarsalgia, left foot: Secondary | ICD-10-CM | POA: Diagnosis not present

## 2020-03-01 DIAGNOSIS — M722 Plantar fascial fibromatosis: Secondary | ICD-10-CM | POA: Diagnosis not present

## 2020-03-01 DIAGNOSIS — M7741 Metatarsalgia, right foot: Secondary | ICD-10-CM | POA: Diagnosis not present

## 2021-01-07 DIAGNOSIS — M79631 Pain in right forearm: Secondary | ICD-10-CM | POA: Diagnosis not present

## 2021-01-07 DIAGNOSIS — M79601 Pain in right arm: Secondary | ICD-10-CM | POA: Diagnosis not present

## 2021-01-07 DIAGNOSIS — M25531 Pain in right wrist: Secondary | ICD-10-CM | POA: Diagnosis not present

## 2021-01-07 DIAGNOSIS — W19XXXA Unspecified fall, initial encounter: Secondary | ICD-10-CM | POA: Diagnosis not present

## 2021-01-07 DIAGNOSIS — M7989 Other specified soft tissue disorders: Secondary | ICD-10-CM | POA: Diagnosis not present

## 2021-01-07 DIAGNOSIS — S5011XA Contusion of right forearm, initial encounter: Secondary | ICD-10-CM | POA: Diagnosis not present

## 2021-04-05 DIAGNOSIS — R102 Pelvic and perineal pain: Secondary | ICD-10-CM | POA: Diagnosis not present

## 2021-04-05 DIAGNOSIS — Z1151 Encounter for screening for human papillomavirus (HPV): Secondary | ICD-10-CM | POA: Diagnosis not present

## 2021-04-05 DIAGNOSIS — R1032 Left lower quadrant pain: Secondary | ICD-10-CM | POA: Diagnosis not present

## 2021-04-05 DIAGNOSIS — Z01419 Encounter for gynecological examination (general) (routine) without abnormal findings: Secondary | ICD-10-CM | POA: Diagnosis not present

## 2021-07-04 DIAGNOSIS — W25XXXA Contact with sharp glass, initial encounter: Secondary | ICD-10-CM | POA: Diagnosis not present

## 2021-07-04 DIAGNOSIS — S61511A Laceration without foreign body of right wrist, initial encounter: Secondary | ICD-10-CM | POA: Diagnosis not present

## 2021-07-04 DIAGNOSIS — S61512A Laceration without foreign body of left wrist, initial encounter: Secondary | ICD-10-CM | POA: Diagnosis not present

## 2021-09-19 ENCOUNTER — Ambulatory Visit (INDEPENDENT_AMBULATORY_CARE_PROVIDER_SITE_OTHER): Payer: BC Managed Care – PPO | Admitting: Family Medicine

## 2021-09-19 ENCOUNTER — Ambulatory Visit: Payer: Self-pay

## 2021-09-19 ENCOUNTER — Ambulatory Visit (HOSPITAL_BASED_OUTPATIENT_CLINIC_OR_DEPARTMENT_OTHER)
Admission: RE | Admit: 2021-09-19 | Discharge: 2021-09-19 | Disposition: A | Discharge status: Home / Self Care | Payer: BC Managed Care – PPO | Source: Ambulatory Visit | Attending: Family Medicine | Admitting: Family Medicine

## 2021-09-19 ENCOUNTER — Encounter: Payer: Self-pay | Admitting: Family Medicine

## 2021-09-19 VITALS — BP 118/80 | Ht 68.0 in | Wt 177.0 lb

## 2021-09-19 DIAGNOSIS — M1611 Unilateral primary osteoarthritis, right hip: Secondary | ICD-10-CM | POA: Diagnosis not present

## 2021-09-19 DIAGNOSIS — M25462 Effusion, left knee: Secondary | ICD-10-CM | POA: Diagnosis not present

## 2021-09-19 DIAGNOSIS — M25461 Effusion, right knee: Secondary | ICD-10-CM

## 2021-09-19 DIAGNOSIS — M23301 Other meniscus derangements, unspecified lateral meniscus, left knee: Secondary | ICD-10-CM | POA: Diagnosis not present

## 2021-09-19 DIAGNOSIS — M25561 Pain in right knee: Secondary | ICD-10-CM

## 2021-09-19 DIAGNOSIS — N3946 Mixed incontinence: Secondary | ICD-10-CM

## 2021-09-19 DIAGNOSIS — R102 Pelvic and perineal pain: Secondary | ICD-10-CM

## 2021-09-19 DIAGNOSIS — G8929 Other chronic pain: Secondary | ICD-10-CM

## 2021-09-19 MED ORDER — TRIAMCINOLONE ACETONIDE 40 MG/ML IJ SUSP
40.0000 mg | Freq: Once | INTRAMUSCULAR | Status: AC
  Administered 2021-09-19: 40 mg via INTRA_ARTICULAR

## 2021-09-19 NOTE — Assessment & Plan Note (Signed)
Acute on chronic in nature.  Has degenerative meniscal changes. -Counseled on home exercise therapy and supportive care. -Injection. -X-ray. -Could consider physical therapy.
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
## 2021-09-19 NOTE — Assessment & Plan Note (Signed)
Acute on chronic in nature.  She sustained several pelvic surgeries in the past has not performed any rehab. -Counseled on supportive care. -Referral to physical therapy for pelvic floor rehab
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
## 2021-09-19 NOTE — Patient Instructions (Signed)
Nice to meet you Please use ice as needed  Please use the insoles.  We have made a referral to physical therapy  I will call with the xray results.   Please send me a message in MyChart with any questions or updates.  Please see me back in 4 weeks.   --Dr. Olean Ree de conocerlo Utilice hielo segn sea necesario. Utilice las plantillas. Hemos hecho una derivacin a fisioterapia. Llamar con los resultados de la radiografa. Enveme un mensaje en MyChart con cualquier pregunta o actualizacin. Por favor, vuelve a verme en 4 semanas.

## 2021-09-19 NOTE — Assessment & Plan Note (Signed)
Acute on chronic in nature. -Referral to physical therapy for pelvic floor rehab.

## 2021-09-19 NOTE — Assessment & Plan Note (Signed)
Acute on chronic in nature.  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 -Counseled on home exercise therapy and supportive care. -Injection. -X-ray. -Could consider physical therapy or custom orthotics

## 2021-09-21 ENCOUNTER — Telehealth: Payer: Self-pay | Admitting: Family Medicine

## 2021-09-21 NOTE — Telephone Encounter (Signed)
A telephone interpretor was used.   Left VM for patient. If she calls back please have her speak with a nurse/CMA and inform that her pelvis x-ray is revealing for a history of previous injuries.  The right knee is suspicious for possible fracture.  If she does not get improvement with the injection of the right knee then we will pursue an MRI of the right knee.  The left knee is showing minor degenerative changes..   If any questions then please take the best time and phone number to call and I will try to call her back.   Myra Rude, MD [facility] 09/21/2021, 8:34 AM

## 2021-09-26 DIAGNOSIS — R079 Chest pain, unspecified: Secondary | ICD-10-CM | POA: Diagnosis not present

## 2021-09-26 DIAGNOSIS — R0602 Shortness of breath: Secondary | ICD-10-CM | POA: Diagnosis not present

## 2021-09-26 DIAGNOSIS — R052 Subacute cough: Secondary | ICD-10-CM | POA: Diagnosis not present

## 2021-09-26 DIAGNOSIS — R059 Cough, unspecified: Secondary | ICD-10-CM | POA: Diagnosis not present

## 2021-09-26 DIAGNOSIS — J40 Bronchitis, not specified as acute or chronic: Secondary | ICD-10-CM | POA: Diagnosis not present

## 2021-09-28 DIAGNOSIS — R001 Bradycardia, unspecified: Secondary | ICD-10-CM | POA: Diagnosis not present

## 2021-09-28 DIAGNOSIS — R079 Chest pain, unspecified: Secondary | ICD-10-CM | POA: Diagnosis not present

## 2021-10-17 ENCOUNTER — Ambulatory Visit (INDEPENDENT_AMBULATORY_CARE_PROVIDER_SITE_OTHER): Payer: BC Managed Care – PPO | Admitting: Family Medicine

## 2021-10-17 ENCOUNTER — Encounter: Payer: Self-pay | Admitting: Family Medicine

## 2021-10-17 VITALS — BP 110/72 | Ht 68.0 in | Wt 177.0 lb

## 2021-10-17 DIAGNOSIS — M25461 Effusion, right knee: Secondary | ICD-10-CM

## 2021-10-17 DIAGNOSIS — M1651 Unilateral post-traumatic osteoarthritis, right hip: Secondary | ICD-10-CM

## 2021-10-17 DIAGNOSIS — M5416 Radiculopathy, lumbar region: Secondary | ICD-10-CM | POA: Diagnosis not present

## 2021-10-17 MED ORDER — GABAPENTIN 300 MG PO CAPS: 300.0000 mg | ORAL_CAPSULE | Freq: Three times a day (TID) | ORAL | 1 refills | Status: AC
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
## 2021-10-17 NOTE — Patient Instructions (Signed)
Good to see you Please start with one gabapentin at night. You can increase this to 2 or 3 times daily as you tolerate.  I have placed a referral to the surgeon.  Please send me a message in MyChart with any questions or updates.  Please see me back in 6-8 weeks.   --Dr. Jordan Likes

## 2021-10-17 NOTE — Assessment & Plan Note (Signed)
Acutely occurring.  Having radicular type pain down the left leg.  Likely related to her abnormal gait and the posttraumatic changes of the right hip. -Counseled on home exercise therapy and supportive care. -Gabapentin. -Consider physical therapy or further imaging

## 2021-10-17 NOTE — Assessment & Plan Note (Signed)
Pain is still ongoing after steroid injection.  Does have a leg length discrepancy with the right leg being shorter. -Counseled on home exercise therapy and supportive care. -Green sport insoles with heel lift on the right.
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
## 2021-10-23 ENCOUNTER — Encounter: Payer: Self-pay | Admitting: Physical Therapy

## 2021-10-23 ENCOUNTER — Ambulatory Visit: Payer: BC Managed Care – PPO | Attending: Family Medicine | Admitting: Physical Therapy

## 2021-10-23 ENCOUNTER — Other Ambulatory Visit: Payer: Self-pay

## 2021-10-23 DIAGNOSIS — N3946 Mixed incontinence: Secondary | ICD-10-CM | POA: Diagnosis not present

## 2021-10-23 DIAGNOSIS — M6281 Muscle weakness (generalized): Secondary | ICD-10-CM | POA: Insufficient documentation

## 2021-10-23 DIAGNOSIS — G8929 Other chronic pain: Secondary | ICD-10-CM | POA: Insufficient documentation

## 2021-10-23 DIAGNOSIS — R102 Pelvic and perineal pain: Secondary | ICD-10-CM | POA: Insufficient documentation

## 2021-10-23 DIAGNOSIS — M62838 Other muscle spasm: Secondary | ICD-10-CM | POA: Diagnosis not present
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
## 2021-11-13 ENCOUNTER — Telehealth: Payer: Self-pay | Admitting: Physical Therapy

## 2021-11-13 ENCOUNTER — Ambulatory Visit: Payer: BC Managed Care – PPO | Admitting: Physical Therapy

## 2021-11-13 NOTE — Telephone Encounter (Signed)
Pt called using interpreter.  She was reminded of next appointment and about no show policy.  Message left on the machine.  Russella Dar, PT 11/13/21 3:05 PM

## 2021-11-13 NOTE — Therapy (Deleted)
OUTPATIENT PHYSICAL THERAPY FEMALE PELVIC TREATMENT   Patient Name: Lynn Shields MRN: [id number] DOB:08/09/1965, 56 y.o., female Today's Date: 11/13/2021     Past Medical History:  Diagnosis Date   Chronic pelvic pain in female 12/09/2015   Generalized postprandial abdominal pain    Hypotension    Post-menopausal bleeding    Urinary incontinence, mixed    Past Surgical History:  Procedure Laterality Date   APPENDECTOMY     COLOSTOMY     Patient Active Problem List   Diagnosis Date Noted   Lumbar radiculopathy 10/17/2021   Post-traumatic osteoarthritis of right hip 10/17/2021   Degenerative tear of lateral meniscus, left 09/19/2021   Knee effusion, right 09/19/2021   Chronic pelvic pain in female 12/09/2015   Urinary incontinence, mixed    Post-menopausal bleeding    Generalized postprandial abdominal pain     PCP: None per patient  REFERRING PROVIDER: Myra Rude, MD  REFERRING DIAG:  N39.46 (ICD-10-CM) - Urinary incontinence, mixed  R10.2,G89.29 (ICD-10-CM) - Chronic pelvic pain in female    THERAPY DIAG:  No diagnosis found.  Rationale for Evaluation and Treatment Rehabilitation  ONSET DATE: injury when 56 y/o has been getting worse over time  SUBJECTIVE:                                                                                                                                                                                           SUBJECTIVE STATEMENT: I had knee injections and that helped for 3 weeks.  I have weakness in my legs and I need to strengthen before I can get surgery.  The incontinence is also because of the weakness.  That (incontinence) started 3 years ago. Vaginal penetration is painful. Fluid intake: Yes: some water more sweet tea, coffee, and soda     PAIN:  Are you having pain? Yes NPRS scale: 10/10 Pain location:  groin to lateral hip and down to the ankle  Pain type: deep bone pain, numbness anteriolateral left  thigh Pain description: intermittent   Aggravating factors: driving and walking, standing for a long time causes low back p Relieving factors: medication  PRECAUTIONS: None  WEIGHT BEARING RESTRICTIONS No  FALLS:  Has patient fallen in last 6 months? Yes. Number of falls 3-4 because I rescue cats and dogs and the ground there is rocky  LIVING ENVIRONMENT: Lives with: lives with their family and lives with their spouse Lives in: House/apartment   OCCUPATION: dog and Education officer, environmental and making flower arrangements  PLOF: Independent  PATIENT GOALS  get stronger to be able to have surgery;   PERTINENT HISTORY:  Traumatic injury  of the hip when 56 y/o, surgery to relocate hip, abdominal surgery Sexual abuse: No  BOWEL MOVEMENT Pain with bowel movement:  Type of bowel movement:Type (Bristol Stool Scale) constipation, Frequency every other day, and Strain Yes sometimes Fully empty rectum:  Leakage:  Pads: Fiber supplement:   URINATION Pain with urination: No Fully empty bladder: Yes:   Stream: Strong Urgency: Yes:   Frequency: hourly Leakage: Coughing, Sneezing, and nocturnal enuresis Pads: No  INTERCOURSE Pain with intercourse: Initial Penetration and During Penetration Ability to have vaginal penetration:  Yes: but moving hurts too much Climax:  Marinoff Scale: 3/3  PREGNANCY Vaginal deliveries No - nulliparous One ectopic pregnancy   PROLAPSE     OBJECTIVE:   DIAGNOSTIC FINDINGS:    PATIENT SURVEYS:    PFIQ-7   COGNITION:  Overall cognitive status: Within functional limits for tasks assessed     SENSATION:    MUSCLE LENGTH: Hamstrings:  Thomas test:   LUMBAR SPECIAL TESTS:    FUNCTIONAL TESTS:  Sit to stand needs UE support and slow to stand (unable to perfrom 5x sit to stand due to time) Single leg stand - unable to do   GAIT:  Comments: antalgic reverse trendelenburg Rt side; forward flexion with weight bearing on the Rt side                POSTURE: rounded shoulders, increased thoracic kyphosis, and weight shift left' Rt hip in ER and unable to maintain neutral position   PELVIC ALIGNMENT:  LUMBARAROM/PROM  A/PROM A/PROM  eval  Flexion   Extension   Right lateral flexion   Left lateral flexion   Right rotation   Left rotation    (Blank rows = not tested)  LOWER EXTREMITY ROM:  Passive ROM Right eval Left eval  Hip flexion    Hip extension    Hip abduction    Hip adduction    Hip internal rotation 25% +Pain   Hip external rotation    Knee flexion    Knee extension    Ankle dorsiflexion    Ankle plantarflexion    Ankle inversion    Ankle eversion     (Blank rows = not tested)  LOWER EXTREMITY MMT:  MMT Right eval Left eval  Hip flexion    Hip extension    Hip abduction 2/5   Hip adduction    Hip internal rotation    Hip external rotation    Knee flexion    Knee extension    Ankle dorsiflexion    Ankle plantarflexion    Ankle inversion    Ankle eversion      PALPATION:   General  restricted scar tissue mid lower abdomen and Rt gluteal region, Rt gluteals tight and internal rotation is painful                External Perineal Exam - very TTP Rt transverse peroneus and levators                             Internal Pelvic Floor severely TTP Rt>Lt light-moderate pressure in any direction  Patient confirms identification and approves PT to assess internal pelvic floor and treatment Yes No emotional/communication barriers or cognitive limitation. Patient is motivated to learn. Patient understands and agrees with treatment goals and plan. PT explains patient will be examined in standing, sitting, and lying down to see how their muscles and joints work. When they are ready, they  will be asked to remove their underwear so PT can examine their perineum. The patient is also given the option of providing their own chaperone as one is not provided in our facility. The patient also has the right and is  explained the right to defer or refuse any part of the evaluation or treatment including the internal exam. With the patient's consent, PT will use one gloved finger to gently assess the muscles of the pelvic floor, seeing how well it contracts and relaxes and if there is muscle symmetry. After, the patient will get dressed and PT and patient will discuss exam findings and plan of care. PT and patient discuss plan of care, schedule, attendance policy and HEP activities.   PELVIC MMT:   MMT eval  Vaginal 2/5 x 3 sec; 4 quick flicks  Internal Anal Sphincter   External Anal Sphincter   Puborectalis   Diastasis Recti   (Blank rows = not tested)        TONE: high  PROLAPSE: none  TODAY'S TREATMENT   10/23/21 EVAL : evaluated and discussed POC   PATIENT EDUCATION:   HOME EXERCISE PROGRAM:   ASSESSMENT:  CLINICAL IMPRESSION: Patient is a 57 y.o. female who was seen today for physical therapy evaluation and treatment for hp pain and pelvic floor dysfunction. Pt had childhood trauma that was treated incorrectly and has subsequent pain and gait deviations that have been worsening.  Pt will be getting Rt hip surgery and was sent to PT to strengthen the hip as well as address pelvic floor issues that have been causing pain and incontinence.  Pt has severely tight scar tissue throughout the right hip and gluteal region.  Pt has high tone and tender levators Rt>Lt and pelvic floor weakness.  Pt has 2/5 MMT Lt hip and gait deviations as mentioned above.  Pt will benefit from skilled PT to address all limitations and improve strength and function so that she will have improved outcomes with upcoming surgery   OBJECTIVE IMPAIRMENTS decreased balance, decreased coordination, decreased endurance, difficulty walking, decreased ROM, decreased strength, hypomobility, increased fascial restrictions, increased muscle spasms, impaired flexibility, impaired tone, postural dysfunction, and pain.    ACTIVITY LIMITATIONS sitting, standing, squatting, transfers, bed mobility, continence, and toileting  PARTICIPATION LIMITATIONS: interpersonal relationship, driving, and community activity  PERSONAL FACTORS 1-2 comorbidities: Traumatic injury of the hip when 56 y/o, surgery to relocate hip, abdominal surgery  are also affecting patient's functional outcome.   REHAB POTENTIAL: Good  CLINICAL DECISION MAKING: Evolving/moderate complexity  EVALUATION COMPLEXITY: Moderate   GOALS: Goals reviewed with patient? Yes  SHORT TERM GOALS: Target date: 11/20/2021  Pt will perform 5x sit to stand for reference of balance and get baseline for follow up goal Baseline: Goal status: INITIAL  2.  Pt will be ind with initial HEP Baseline:  Goal status: INITIAL  3.  Pt will be proficient at using an AD for ambulating with improved gait mechanics Baseline:  Goal status: INITIAL   LONG TERM GOALS: Target date: 01/15/2022   Pt will report 25% less pain due to improved posture and gait Baseline:  Goal status: INITIAL  2.  Pt will be independent with advanced HEP to maintain improvements made throughout therapy and improve surgical outcomes Baseline:  Goal status: INITIAL  3.  Pt will have 75% reduced leakage during a typical day  Baseline:  Goal status: INITIAL  4.  Pt will demonstrate at least 4/5 MMT of the Rt hip for improved outcomes of  surgery Baseline:  Goal status: INITIAL    PLAN: PT FREQUENCY: 1-2x/week  PT DURATION: 12 weeks  PLANNED INTERVENTIONS: Therapeutic exercises, Therapeutic activity, Neuromuscular re-education, Balance training, Gait training, Patient/Family education, Self Care, Joint mobilization, Dry Needling, Electrical stimulation, Cryotherapy, Moist heat, scar mobilization, Taping, Biofeedback, Manual therapy, and Re-evaluation  PLAN FOR NEXT SESSION: look at using AD to improve gait mechanics for improved and more efficient glute recruitment, gravity  reduced glute strengthening, scar tissue massage and dry needling abdomen and hip   Camillo Flaming Lisbeth Puller, PT 11/13/2021, 9:13 AM

## 2021-11-14 ENCOUNTER — Ambulatory Visit: Payer: BC Managed Care – PPO | Attending: Family Medicine | Admitting: Physical Therapy

## 2021-11-14 ENCOUNTER — Encounter: Payer: Self-pay | Admitting: Physical Therapy

## 2021-11-14 DIAGNOSIS — M62838 Other muscle spasm: Secondary | ICD-10-CM

## 2021-11-14 DIAGNOSIS — M6281 Muscle weakness (generalized): Secondary | ICD-10-CM | POA: Diagnosis not present

## 2021-11-14 NOTE — Therapy (Signed)
OUTPATIENT PHYSICAL THERAPY FEMALE PELVIC TREATMENT   Patient Name: Lynn Shields MRN: [id number] DOB:05/24/1965, 56 y.o., female Today's Date: 11/14/2021   PT End of Session - 11/14/21 1620     Visit Number 2    Date for PT Re-Evaluation 01/15/22    Authorization Type bcbs    PT Start Time 1405    PT Stop Time 1446    PT Time Calculation (min) 41 min    Activity Tolerance Patient tolerated treatment well    Behavior During Therapy [facility] for tasks assessed/performed              Past Medical History:  Diagnosis Date   Chronic pelvic pain in female 12/09/2015   Generalized postprandial abdominal pain    Hypotension    Post-menopausal bleeding    Urinary incontinence, mixed    Past Surgical History:  Procedure Laterality Date   APPENDECTOMY     COLOSTOMY     Patient Active Problem List   Diagnosis Date Noted   Lumbar radiculopathy 10/17/2021   Post-traumatic osteoarthritis of right hip 10/17/2021   Degenerative tear of lateral meniscus, left 09/19/2021   Knee effusion, right 09/19/2021   Chronic pelvic pain in female 12/09/2015   Urinary incontinence, mixed    Post-menopausal bleeding    Generalized postprandial abdominal pain     PCP: None per patient  REFERRING PROVIDER: Rosemarie Ax, MD  REFERRING DIAG:  N39.46 (ICD-10-CM) - Urinary incontinence, mixed  R10.2,G89.29 (ICD-10-CM) - Chronic pelvic pain in female    THERAPY DIAG:  Muscle weakness (generalized)  Other muscle spasm  Rationale for Evaluation and Treatment Rehabilitation  ONSET DATE: injury when 56 y/o has been getting worse over time  SUBJECTIVE:                                                                                                                                                                                           SUBJECTIVE STATEMENT: Pt is the same.  Pt states today pain not as bad, has taken medicine.  I am worried about falling, I have fallen again. Fluid  intake: Yes: some water more sweet tea, coffee, and soda     PAIN:  Are you having pain? Yes NPRS scale: (moderate)/10 Pain location:  groin to lateral hip and down to the ankle  Pain type: deep bone pain, numbness anteriolateral left thigh Pain description: intermittent   Aggravating factors: driving and walking, standing for a long time causes low back p Relieving factors: medication  PRECAUTIONS: None  WEIGHT BEARING RESTRICTIONS No  FALLS:  Has patient fallen in last 6 months? Yes. 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  PLOF: Independent  PATIENT GOALS  get stronger to be able to have surgery;   PERTINENT HISTORY:  Traumatic injury of the hip when 56 y/o, surgery to relocate hip, abdominal surgery Sexual abuse: No  BOWEL MOVEMENT Pain with bowel movement:  Type of bowel movement:Type (Bristol Stool Scale) constipation, Frequency every other day, and Strain Yes sometimes Fully empty rectum:  Leakage:  Pads: Fiber supplement:   URINATION Pain with urination: No Fully empty bladder: Yes:   Stream: Strong Urgency: Yes:   Frequency: hourly Leakage: Coughing, Sneezing, and nocturnal enuresis Pads: No  INTERCOURSE Pain with intercourse: Initial Penetration and During Penetration Ability to have vaginal penetration:  Yes: but moving hurts too much Climax:  Marinoff Scale: 3/3  PREGNANCY Vaginal deliveries No - nulliparous One ectopic pregnancy   PROLAPSE     OBJECTIVE:   DIAGNOSTIC FINDINGS:    PATIENT SURVEYS:    PFIQ-7   COGNITION:  Overall cognitive status: Within functional limits for tasks assessed     SENSATION:    MUSCLE LENGTH: Hamstrings:  Thomas test:   LUMBAR SPECIAL TESTS:    FUNCTIONAL TESTS:  Sit to  stand needs UE support and slow to stand (unable to perfrom 5x sit to stand due to time) Single leg stand - unable to do   GAIT:  Comments: antalgic reverse trendelenburg Rt side; forward flexion with weight bearing on the Rt side               POSTURE: rounded shoulders, increased thoracic kyphosis, and weight shift left' Rt hip in ER and unable to maintain neutral position   PELVIC ALIGNMENT:  LUMBARAROM/PROM  A/PROM A/PROM  eval  Flexion   Extension   Right lateral flexion   Left lateral flexion   Right rotation   Left rotation    (Blank rows = not tested)  LOWER EXTREMITY ROM:  Passive ROM Right eval Left eval  Hip flexion    Hip extension    Hip abduction    Hip adduction    Hip internal rotation 25% +Pain   Hip external rotation    Knee flexion    Knee extension    Ankle dorsiflexion    Ankle plantarflexion    Ankle inversion    Ankle eversion     (Blank rows = not tested)  LOWER EXTREMITY MMT:  MMT Right eval Left eval  Hip flexion    Hip extension    Hip abduction 2/5   Hip adduction    Hip internal rotation    Hip external rotation    Knee flexion    Knee extension    Ankle dorsiflexion    Ankle plantarflexion    Ankle inversion    Ankle eversion      PALPATION:   General  restricted scar tissue mid lower abdomen and Rt gluteal region, Rt gluteals tight and internal rotation is painful                External Perineal Exam - very TTP Rt transverse peroneus and levators                             Internal Pelvic Floor severely TTP Rt>Lt light-moderate pressure  in any direction  Patient confirms identification and approves PT to assess internal pelvic floor and treatment Yes No emotional/communication barriers or cognitive limitation. Patient is motivated to learn. Patient understands and agrees with treatment goals and plan. PT explains patient will be examined in standing, sitting, and lying down to see how their muscles and joints work.  When they are ready, they will be asked to remove their underwear so PT can examine their perineum. The patient is also given the option of providing their own chaperone as one is not provided in our facility. The patient also has the right and is explained the right to defer or refuse any part of the evaluation or treatment including the internal exam. With the patient's consent, PT will use one gloved finger to gently assess the muscles of the pelvic floor, seeing how well it contracts and relaxes and if there is muscle symmetry. After, the patient will get dressed and PT and patient will discuss exam findings and plan of care. PT and patient discuss plan of care, schedule, attendance policy and HEP activities.   PELVIC MMT:   MMT eval  Vaginal 2/5 x 3 sec; 4 quick flicks  Internal Anal Sphincter   External Anal Sphincter   Puborectalis   Diastasis Recti   (Blank rows = not tested)        TONE: high  PROLAPSE: none  TODAY'S TREATMENT  Date: 11/14/21 Self care: Cupping and shoe - evenUP info Using crutches in various ways to attempt more even gait - crutches not helpful and disregarded  Nuero Re-ed: Education and cues for coordination of breathing  Pelvic floor muscle contracting and relaxing at appropriate times during activities  Exercises: Clam, hip ext, standing hip ext and abduction - 20x each  Manual: Cupping to scar tissue  10/23/21 EVAL : evaluated and discussed POC   PATIENT EDUCATION: Education details: Access Code: [email or web address] Person educated: Patient and Spouse Education method: Explanation, Demonstration, Tactile cues, Verbal cues, and Handouts Education comprehension: verbalized understanding and returned demonstration    HOME EXERCISE PROGRAM: Access Code: [email or web address] URL: [email or web address] Date: 11/14/2021 Prepared by: Dwana Curd  Exercises - Prone Hip Extension Sequence  - 1 x daily - 7 x weekly - 3 sets - 10 reps - Beginner  Clam  - 1 x daily - 7 x weekly - 3 sets - 10 reps - Standing Hip Extension with Counter Support  - 1 x daily - 7 x weekly - 3 sets - 10 reps - Standing Hip Abduction with Counter Support  - 1 x daily - 7 x weekly - 3 sets - 10 reps  ASSESSMENT:  CLINICAL IMPRESSION: Patient is accompanied by husband today to interpret.  Pt did well with very basic exercises and fatigues quickly. She was shown cup and shoe lift to purchase to address LLD and work on more even gait.  Pt fatigued quickly during hip exercises.  Pt will benefit from skilled PT to address all impairments and return to maximum function.   OBJECTIVE IMPAIRMENTS decreased balance, decreased coordination, decreased endurance, difficulty walking, decreased ROM, decreased strength, hypomobility, increased fascial restrictions, increased muscle spasms, impaired flexibility, impaired tone, postural dysfunction, and pain.   ACTIVITY LIMITATIONS sitting, standing, squatting, transfers, bed mobility, continence, and toileting  PARTICIPATION LIMITATIONS: interpersonal relationship, driving, and community activity  PERSONAL FACTORS 1-2 comorbidities: Traumatic injury of the hip when 56 y/o, surgery to relocate hip, abdominal surgery  are also affecting patient's functional outcome.   REHAB POTENTIAL: Good  CLINICAL DECISION MAKING: Evolving/moderate complexity  EVALUATION COMPLEXITY: Moderate   GOALS: Goals reviewed with patient? Yes  SHORT TERM GOALS: Target date: 11/20/2021  Pt will perform 5x sit to stand for reference of balance and get baseline for follow up goal Baseline: Goal status: INITIAL  2.  Pt will be ind with initial HEP Baseline:  Goal status: INITIAL  3.  Pt will be proficient at using an AD for ambulating with improved gait mechanics Baseline:  Goal status: INITIAL   LONG TERM GOALS: Target date: 01/15/2022   Pt will report 25% less pain due to improved posture and gait Baseline:  Goal status: INITIAL  2.   Pt will be independent with advanced HEP to maintain improvements made throughout therapy and improve surgical outcomes Baseline:  Goal status: INITIAL  3.  Pt will have 75% reduced leakage during a typical day  Baseline:  Goal status: INITIAL  4.  Pt will demonstrate at least 4/5 MMT of the Rt hip for improved outcomes of surgery Baseline:  Goal status: INITIAL    PLAN: PT FREQUENCY: 1-2x/week  PT DURATION: 12 weeks  PLANNED INTERVENTIONS: Therapeutic exercises, Therapeutic activity, Neuromuscular re-education, Balance training, Gait training, Patient/Family education, Self Care, Joint mobilization, Dry Needling, Electrical stimulation, Cryotherapy, Moist heat, scar mobilization, Taping, Biofeedback, Manual therapy, and Re-evaluation  PLAN FOR NEXT SESSION: hip and core strength; internal pelvic floor and abodminal stretch and scar release   Brayton Caves Lucion Dilger, PT 11/14/2021, 4:21 PM

## 2021-11-19 NOTE — Therapy (Addendum)
OUTPATIENT PHYSICAL THERAPY FEMALE PELVIC TREATMENT   Patient Name: SINCLAIRE ARTIGA MRN: [id number] DOB:09-20-1965, 56 y.o., female Today's Date: 11/20/2021   PT End of Session - 11/20/21 1258     Visit Number 3    Date for PT Re-Evaluation 01/15/22    Authorization Type bcbs    PT Start Time 1233    PT Stop Time 1313    PT Time Calculation (min) 40 min    Activity Tolerance Patient tolerated treatment well    Behavior During Therapy [facility] for tasks assessed/performed               Past Medical History:  Diagnosis Date   Chronic pelvic pain in female 12/09/2015   Generalized postprandial abdominal pain    Hypotension    Post-menopausal bleeding    Urinary incontinence, mixed    Past Surgical History:  Procedure Laterality Date   APPENDECTOMY     COLOSTOMY     Patient Active Problem List   Diagnosis Date Noted   Lumbar radiculopathy 10/17/2021   Post-traumatic osteoarthritis of right hip 10/17/2021   Degenerative tear of lateral meniscus, left 09/19/2021   Knee effusion, right 09/19/2021   Chronic pelvic pain in female 12/09/2015   Urinary incontinence, mixed    Post-menopausal bleeding    Generalized postprandial abdominal pain     PCP: None per patient  REFERRING PROVIDER: Rosemarie Ax, MD  REFERRING DIAG:  N39.46 (ICD-10-CM) - Urinary incontinence, mixed  R10.2,G89.29 (ICD-10-CM) - Chronic pelvic pain in female    THERAPY DIAG:  Muscle weakness (generalized)  Other muscle spasm  Rationale for Evaluation and Treatment Rehabilitation  ONSET DATE: injury when 56 y/o has been getting worse over time  SUBJECTIVE:                                                                                                                                                                                           SUBJECTIVE STATEMENT: Pt is having a lot of pain today since doing the clam exercise.  She reports that one hurts the most Fluid intake: Yes: some  water more sweet tea, coffee, and soda     PAIN:  Are you having pain? Yes NPRS scale: 8/10 Pain location:  groin to lateral hip and down to the ankle  Pain type: deep bone pain, numbness anteriolateral left thigh Pain description: intermittent   Aggravating factors: driving and walking, standing for a long time causes low back p Relieving factors: medication  PRECAUTIONS: None  WEIGHT BEARING RESTRICTIONS No  FALLS:  Has patient fallen in last 6 months? Yes. 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  PLOF: Independent  PATIENT GOALS  get stronger to be able to have surgery;   PERTINENT HISTORY:  Traumatic injury of the hip when 56 y/o, surgery to relocate hip, abdominal surgery Sexual abuse: No  BOWEL MOVEMENT Pain with bowel movement:  Type of bowel movement:Type (Bristol Stool Scale) constipation, Frequency every other day, and Strain Yes sometimes Fully empty rectum:  Leakage:  Pads: Fiber supplement:   URINATION Pain with urination: No Fully empty bladder: Yes:   Stream: Strong Urgency: Yes:   Frequency: hourly Leakage: Coughing, Sneezing, and nocturnal enuresis Pads: No  INTERCOURSE Pain with intercourse: Initial Penetration and During Penetration Ability to have vaginal penetration:  Yes: but moving hurts too much Climax:  Marinoff Scale: 3/3  PREGNANCY Vaginal deliveries No - nulliparous One ectopic pregnancy   PROLAPSE     OBJECTIVE:   DIAGNOSTIC FINDINGS:    PATIENT SURVEYS:    PFIQ-7   COGNITION:  Overall cognitive status: Within functional limits for tasks assessed     SENSATION:    MUSCLE LENGTH: Hamstrings:  Thomas test:   LUMBAR SPECIAL TESTS:    FUNCTIONAL TESTS:  Sit to stand needs UE support and  slow to stand (unable to perfrom 5x sit to stand due to time) Single leg stand - unable to do   GAIT:  Comments: antalgic reverse trendelenburg Rt side; forward flexion with weight bearing on the Rt side               POSTURE: rounded shoulders, increased thoracic kyphosis, and weight shift left' Rt hip in ER and unable to maintain neutral position   PELVIC ALIGNMENT:  LUMBARAROM/PROM  A/PROM A/PROM  eval  Flexion   Extension   Right lateral flexion   Left lateral flexion   Right rotation   Left rotation    (Blank rows = not tested)  LOWER EXTREMITY ROM:  Passive ROM Right eval Left eval  Hip flexion    Hip extension    Hip abduction    Hip adduction    Hip internal rotation 25% +Pain   Hip external rotation    Knee flexion    Knee extension    Ankle dorsiflexion    Ankle plantarflexion    Ankle inversion    Ankle eversion     (Blank rows = not tested)  LOWER EXTREMITY MMT:  MMT Right eval Left eval  Hip flexion    Hip extension    Hip abduction 2/5   Hip adduction    Hip internal rotation    Hip external rotation    Knee flexion    Knee extension    Ankle dorsiflexion    Ankle plantarflexion    Ankle inversion    Ankle eversion      PALPATION:   General  restricted scar tissue mid lower abdomen and Rt gluteal region, Rt gluteals tight and internal rotation is painful                External Perineal Exam - very TTP Rt transverse peroneus and levators                             Internal Pelvic Floor severely TTP Rt>Lt light-moderate pressure in any direction  Patient confirms identification and approves PT to assess internal pelvic floor and treatment Yes No emotional/communication barriers or cognitive limitation. Patient is motivated to learn. Patient understands and agrees with treatment goals and plan. PT explains patient will be examined in standing, sitting, and lying down to see how their muscles and joints work. When they are ready, they  will be asked to remove their underwear so PT can examine their perineum. The patient is also given the option of providing their own chaperone as one is not provided in our facility. The patient also has the right and is explained the right to defer or refuse any part of the evaluation or treatment including the internal exam. With the patient's consent, PT will use one gloved finger to gently assess the muscles of the pelvic floor, seeing how well it contracts and relaxes and if there is muscle symmetry. After, the patient will get dressed and PT and patient will discuss exam findings and plan of care. PT and patient discuss plan of care, schedule, attendance policy and HEP activities.   PELVIC MMT:   MMT eval  Vaginal 2/5 x 3 sec; 4 quick flicks  Internal Anal Sphincter   External Anal Sphincter   Puborectalis   Diastasis Recti   (Blank rows = not tested)        TONE: high  PROLAPSE: none  TODAY'S TREATMENT  Date: 11/20/21 Self care: None today  Nuero Re-ed: Education and cues for coordination of breathing   Exercises: Clam - blue March - blue  Manual: Addaday to Rt hip/gluteals - very tender and pain with going over the incision Patient confirms identification and approves physical therapist to perform internal soft tissue work  Internal to compressor urethra and fascial release around vaginal canal - bil more tender on Rt side  Date: 11/14/21 Self care: Cupping and shoe - evenUP info Using crutches in various ways to attempt more even gait - crutches not helpful and disregarded  Nuero Re-ed: Education and cues for coordination of breathing  Pelvic floor muscle contracting and relaxing at appropriate times during activities  Exercises: Clam, hip ext, standing hip ext and abduction - 20x each  Manual: cupping  10/23/21 EVAL : evaluated and discussed POC   PATIENT EDUCATION: Education details: Access Code: [email or web address] Person educated: Patient and  Spouse Education method: Explanation, Demonstration, Tactile cues, Verbal cues, and Handouts Education comprehension: verbalized understanding and returned demonstration    HOME EXERCISE PROGRAM: Access Code: [email or web address] URL: [email or web address] Date: 11/14/2021 Prepared by: Jari Favre  Exercises - Prone Hip Extension Sequence  - 1 x daily - 7 x weekly - 3 sets - 10 reps - Beginner Clam  - 1 x daily - 7 x weekly - 3 sets - 10 reps - Standing Hip Extension with Counter Support  - 1 x daily - 7 x weekly - 3 sets - 10 reps - Standing Hip Abduction with Counter Support  - 1 x daily - 7 x weekly - 3 sets - 10 reps  ASSESSMENT:  CLINICAL IMPRESSION: Today's session focused on continued gluteal strenght.  Pt needed adjustments to HEP due to flare up of pain with current exercises. Pt was given supine band ex to replace the clamshell.  Pt also tolerated internal STM to vaginal walls.  Pt was very tender to palpation only tolerating layers 1-2 with light fascial release, Lt side releasing more than Rt.   OBJECTIVE IMPAIRMENTS decreased balance, decreased coordination, decreased endurance, difficulty walking, decreased ROM, decreased strength, hypomobility,  increased fascial restrictions, increased muscle spasms, impaired flexibility, impaired tone, postural dysfunction, and pain.   ACTIVITY LIMITATIONS sitting, standing, squatting, transfers, bed mobility, continence, and toileting  PARTICIPATION LIMITATIONS: interpersonal relationship, driving, and community activity  PERSONAL FACTORS 1-2 comorbidities: Traumatic injury of the hip when 56 y/o, surgery to relocate hip, abdominal surgery  are also affecting patient's functional outcome.   REHAB POTENTIAL: Good  CLINICAL DECISION MAKING: Evolving/moderate complexity  EVALUATION COMPLEXITY: Moderate   GOALS: Goals reviewed with patient? Yes  SHORT TERM GOALS: Target date: 11/20/2021 .updated 11/20/21  Pt will  perform 5x sit to stand for reference of balance and get baseline for follow up goal Baseline: Goal status: INITIAL  2.  Pt will be ind with initial HEP Baseline:  Goal status: MET  3.  Pt will be proficient at using an AD for ambulating with improved gait mechanics Baseline: reviewed and able to do this Goal status: MET   LONG TERM GOALS: Target date: 01/15/2022   Pt will report 25% less pain due to improved posture and gait Baseline:  Goal status: INITIAL  2.  Pt will be independent with advanced HEP to maintain improvements made throughout therapy and improve surgical outcomes Baseline:  Goal status: INITIAL  3.  Pt will have 75% reduced leakage during a typical day  Baseline:  Goal status: INITIAL  4.  Pt will demonstrate at least 4/5 MMT of the Rt hip for improved outcomes of surgery Baseline:  Goal status: INITIAL    PLAN: PT FREQUENCY: 1-2x/week  PT DURATION: 12 weeks  PLANNED INTERVENTIONS: Therapeutic exercises, Therapeutic activity, Neuromuscular re-education, Balance training, Gait training, Patient/Family education, Self Care, Joint mobilization, Dry Needling, Electrical stimulation, Cryotherapy, Moist heat, scar mobilization, Taping, Biofeedback, Manual therapy, and Re-evaluation  PLAN FOR NEXT SESSION: hip and core strength; internal pelvic floor and abodminal stretch and scar release   Jakki L Ryszard Socarras, PT 11/20/2021, 1:52 PM   PHYSICAL THERAPY DISCHARGE SUMMARY  Visits from Start of Care: 3  Current functional level related to goals / functional outcomes:   See above goals Remaining deficits: See above deficits   Education / Equipment: HEP   Patient agrees to discharge. Patient goals were not met. Patient is being discharged due to the patient's request.  Pt called and wanted to cancel therapy appointments  [facility], PT 12/04/21 10:27 AM

## 2021-11-20 ENCOUNTER — Ambulatory Visit: Payer: BC Managed Care – PPO | Admitting: Physical Therapy

## 2021-11-20 DIAGNOSIS — M6281 Muscle weakness (generalized): Secondary | ICD-10-CM

## 2021-11-20 DIAGNOSIS — M62838 Other muscle spasm: Secondary | ICD-10-CM | POA: Diagnosis not present

## 2021-11-21 ENCOUNTER — Ambulatory Visit: Payer: BC Managed Care – PPO | Admitting: Physical Therapy

## 2021-11-27 ENCOUNTER — Ambulatory Visit: Payer: BC Managed Care – PPO | Admitting: Physical Therapy

## 2021-11-28 ENCOUNTER — Ambulatory Visit: Payer: BC Managed Care – PPO | Admitting: Physical Therapy

## 2021-11-28 ENCOUNTER — Ambulatory Visit: Payer: BC Managed Care – PPO | Admitting: Family Medicine

## 2021-12-04 ENCOUNTER — Ambulatory Visit: Payer: BC Managed Care – PPO | Admitting: Physical Therapy

## 2021-12-05 ENCOUNTER — Ambulatory Visit: Payer: BC Managed Care – PPO | Admitting: Physical Therapy

## 2021-12-11 ENCOUNTER — Ambulatory Visit: Payer: BC Managed Care – PPO | Admitting: Physical Therapy

## 2021-12-18 ENCOUNTER — Encounter: Payer: BC Managed Care – PPO | Admitting: Physical Therapy

## 2021-12-25 ENCOUNTER — Encounter: Payer: BC Managed Care – PPO | Admitting: Physical Therapy

## 2021-12-26 DIAGNOSIS — M25551 Pain in right hip: Secondary | ICD-10-CM | POA: Diagnosis not present

## 2021-12-26 DIAGNOSIS — M1611 Unilateral primary osteoarthritis, right hip: Secondary | ICD-10-CM | POA: Diagnosis not present

## 2022-01-01 ENCOUNTER — Encounter: Payer: BC Managed Care – PPO | Admitting: Physical Therapy

## 2022-01-02 ENCOUNTER — Other Ambulatory Visit: Payer: Self-pay | Admitting: Orthopedic Surgery

## 2022-01-02 DIAGNOSIS — M25551 Pain in right hip: Secondary | ICD-10-CM

## 2022-01-08 DIAGNOSIS — Z6827 Body mass index (BMI) 27.0-27.9, adult: Secondary | ICD-10-CM | POA: Diagnosis not present

## 2022-01-08 DIAGNOSIS — R0602 Shortness of breath: Secondary | ICD-10-CM | POA: Diagnosis not present

## 2022-01-08 DIAGNOSIS — N3281 Overactive bladder: Secondary | ICD-10-CM | POA: Diagnosis not present

## 2022-01-08 DIAGNOSIS — Z0000 Encounter for general adult medical examination without abnormal findings: Secondary | ICD-10-CM | POA: Diagnosis not present

## 2022-01-08 DIAGNOSIS — F331 Major depressive disorder, recurrent, moderate: Secondary | ICD-10-CM | POA: Diagnosis not present

## 2022-01-09 DIAGNOSIS — R0602 Shortness of breath: Secondary | ICD-10-CM | POA: Diagnosis not present

## 2022-02-12 DIAGNOSIS — R509 Fever, unspecified: Secondary | ICD-10-CM | POA: Diagnosis not present

## 2022-02-12 DIAGNOSIS — R079 Chest pain, unspecified: Secondary | ICD-10-CM | POA: Diagnosis not present

## 2022-02-12 DIAGNOSIS — Z5321 Procedure and treatment not carried out due to patient leaving prior to being seen by health care provider: Secondary | ICD-10-CM | POA: Diagnosis not present

## 2022-02-14 DIAGNOSIS — J029 Acute pharyngitis, unspecified: Secondary | ICD-10-CM | POA: Diagnosis not present

## 2022-02-14 DIAGNOSIS — J069 Acute upper respiratory infection, unspecified: Secondary | ICD-10-CM | POA: Diagnosis not present

## 2022-02-14 DIAGNOSIS — R051 Acute cough: Secondary | ICD-10-CM | POA: Diagnosis not present

## 2022-03-01 DIAGNOSIS — R1012 Left upper quadrant pain: Secondary | ICD-10-CM | POA: Diagnosis not present

## 2022-03-01 DIAGNOSIS — R110 Nausea: Secondary | ICD-10-CM | POA: Diagnosis not present

## 2022-03-01 DIAGNOSIS — K219 Gastro-esophageal reflux disease without esophagitis: Secondary | ICD-10-CM | POA: Diagnosis not present

## 2022-03-01 DIAGNOSIS — K5900 Constipation, unspecified: Secondary | ICD-10-CM | POA: Diagnosis not present

## 2022-04-28 IMAGING — MR MR CERVICAL SPINE W/O CM
5 series · 29 of 48 positions shown · non-contrast
Comparison: 09/08/2016

CLINICAL DATA: Neck pain and left arm pain for 5 years

EXAM:
MRI CERVICAL SPINE WITHOUT CONTRAST
TECHNIQUE: Multiplanar, multisequence MR imaging of the cervical spine was
performed. No intravenous contrast was administered.

[Series 3: T2 · sagittal · 3.0mm · 0.66mm/px · 6 of 12 slices shown (1 of 2)]
[im 1/12]
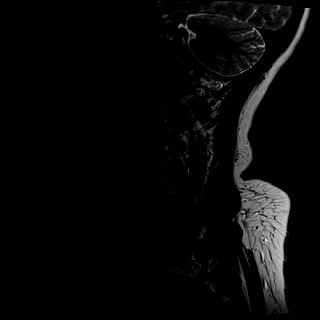
[im 3/12]
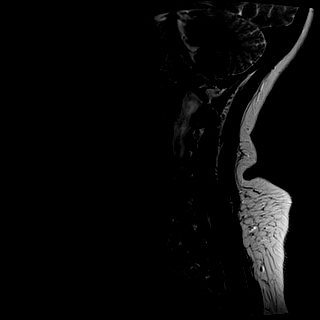
[im 5/12]
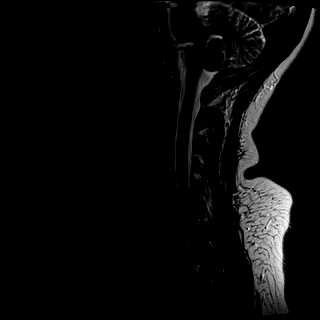
[im 7/12]
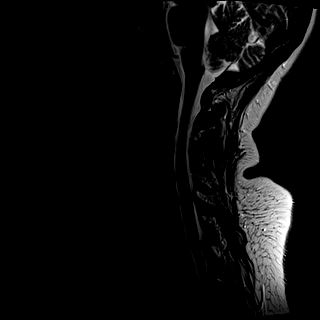
[im 9/12]
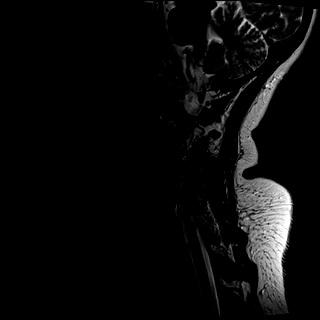
[im 12/12]
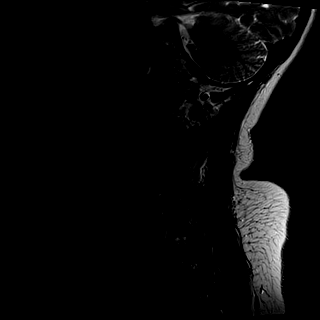

[Series 4: T1 · sagittal · 3.0mm · 0.41mm/px · 6 of 12 slices shown]
[im 1/12]
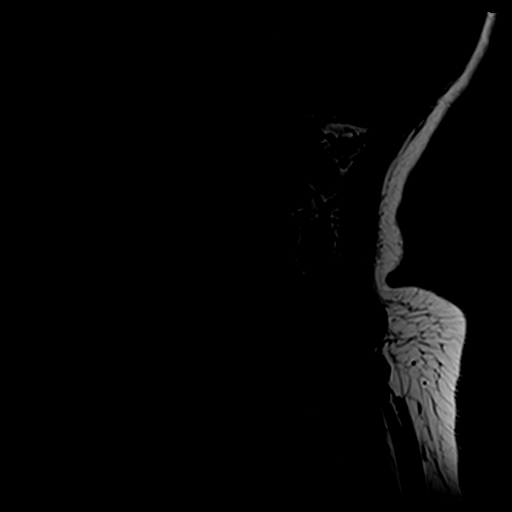
[im 3/12]
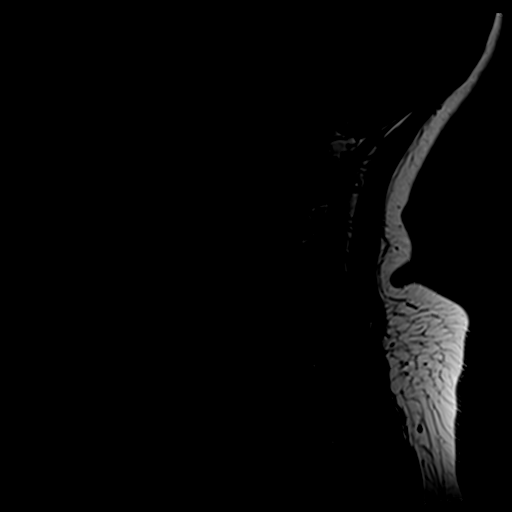
[im 5/12]
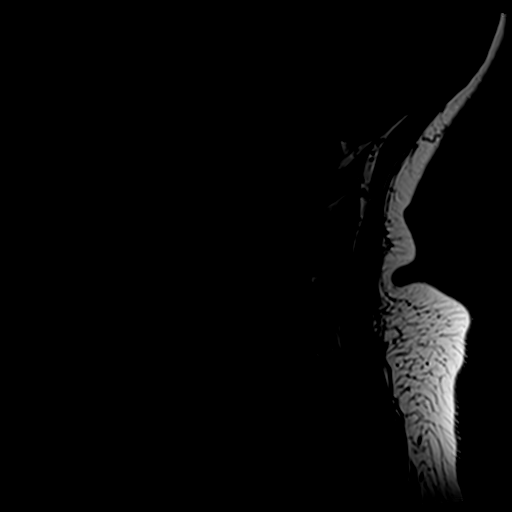
[im 7/12]
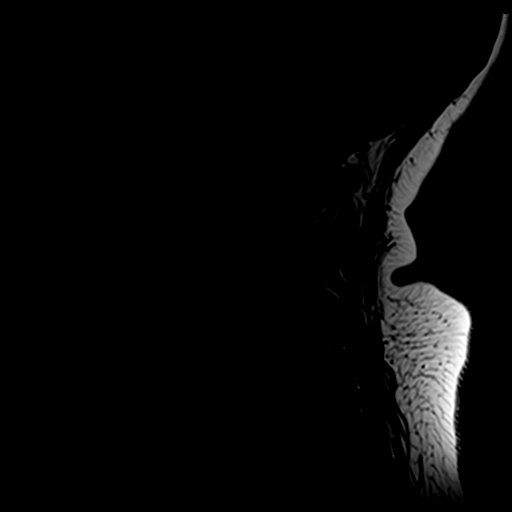
[im 9/12]
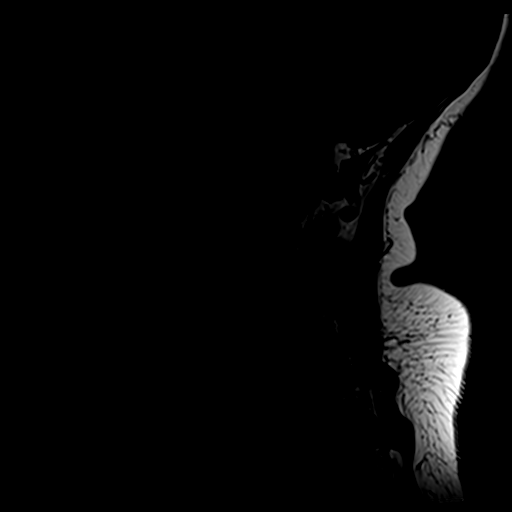
[im 12/12]
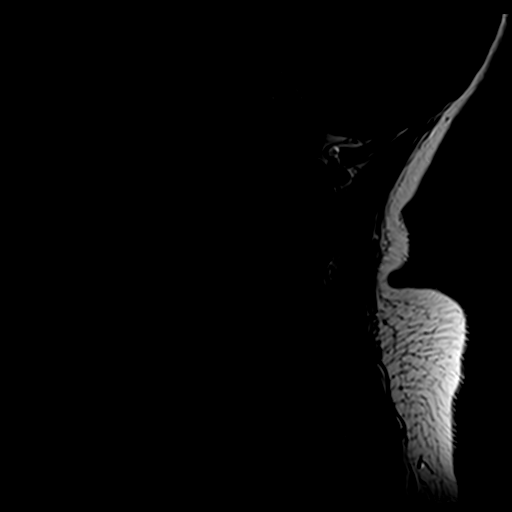

[Series 5: tir sag · sagittal · 3.0mm · 0.41mm/px · 6 of 12 slices shown]
[im 1/12]
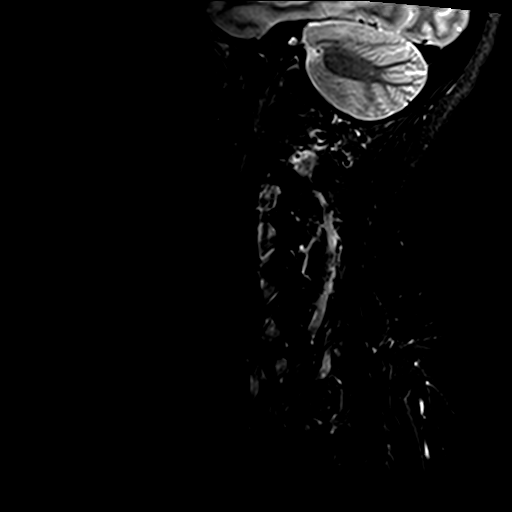
[im 3/12]
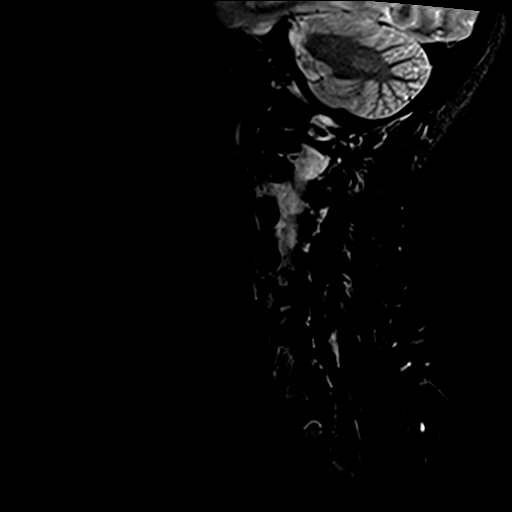
[im 5/12]
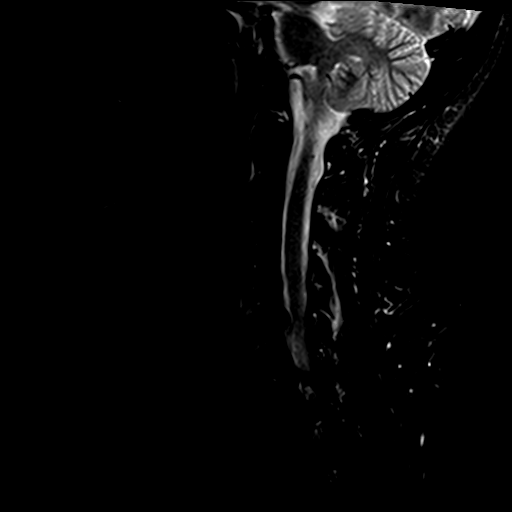
[im 7/12]
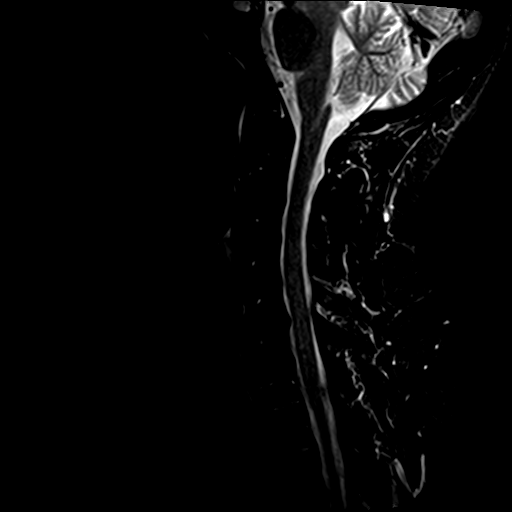
[im 9/12]
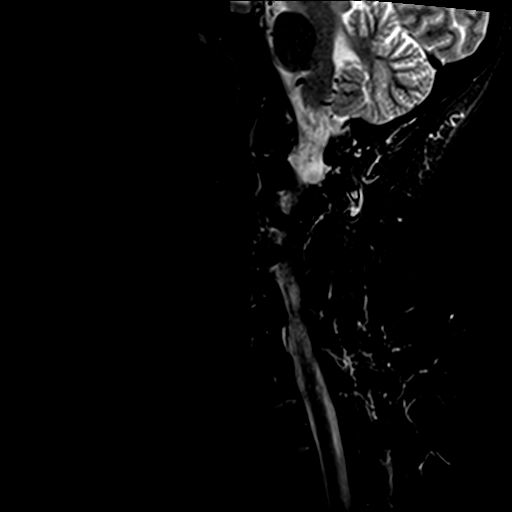
[im 12/12]
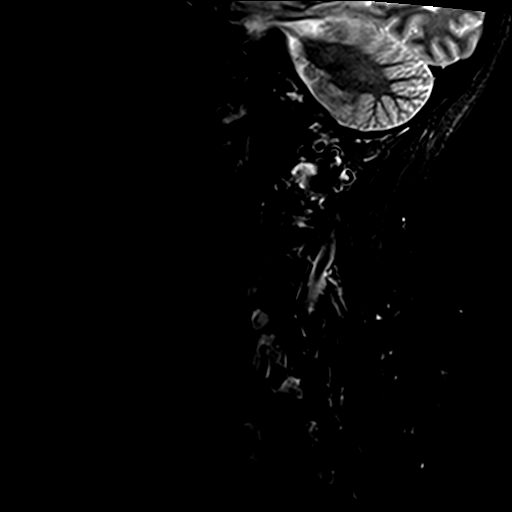

[Series 6: GRE · axial · 3.0mm · 0.35mm/px · z∈[-86,-75]mm · 2 of 28 slices shown]
[im 1/28]
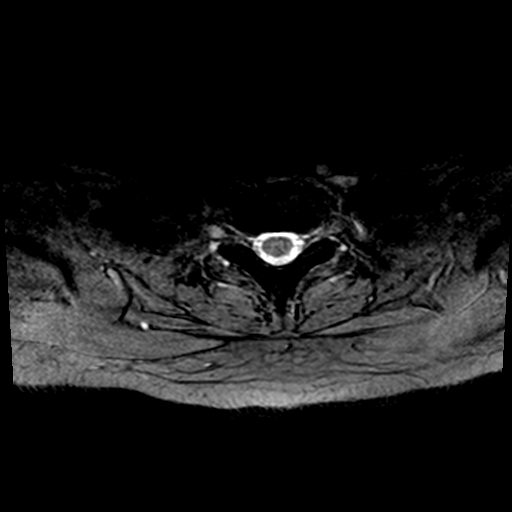
[im 4/28]
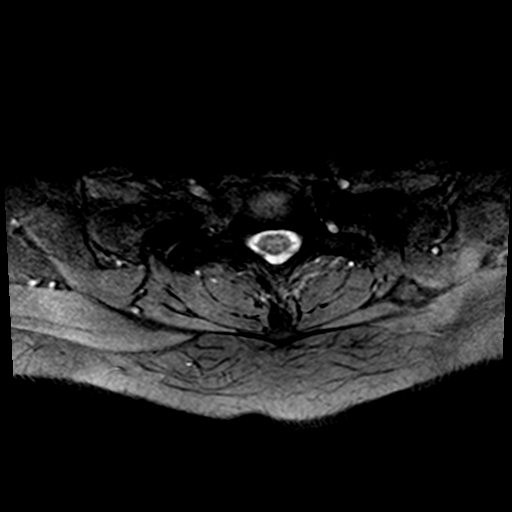

[Series 7: T2 · axial · 3.0mm · 0.70mm/px · z∈[-86,+12]mm · 9 of 28 slices shown (2 of 2)]
[im 1/28]
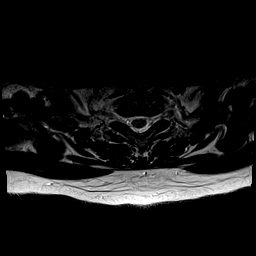
[im 4/28]
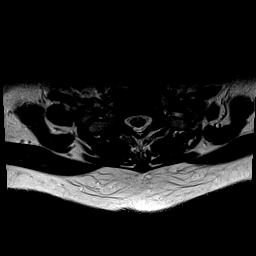
[im 8/28]
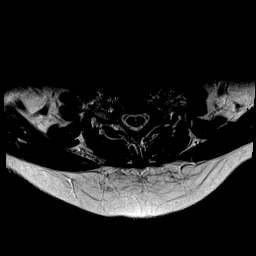
[im 12/28]
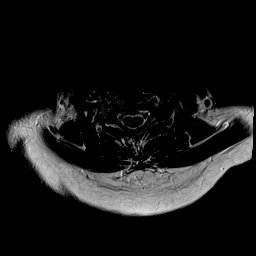
[im 14/28]
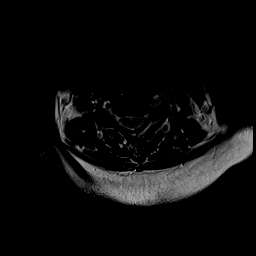
[im 16/28]
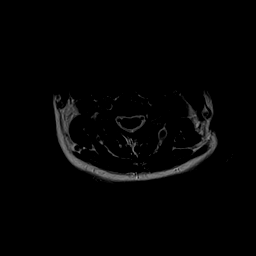
[im 20/28]
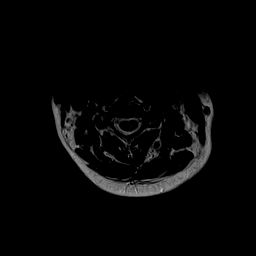
[im 24/28]
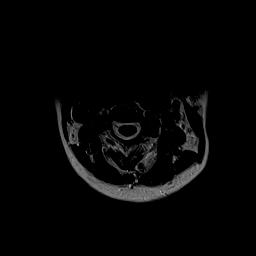
[im 28/28]
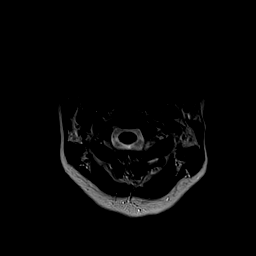

[29 of 48 positions shown; findings below may reference images not displayed]

FINDINGS: Alignment: Mild curvature of the cervical spine.  No listhesis.

Vertebrae: No fracture, evidence of discitis, or bone lesion.

Cord: Normal signal and morphology.

Posterior Fossa, vertebral arteries, paraspinal tissues: Negative.

Disc levels:

C2-3: Unremarkable.

C3-4: Unremarkable.

C4-5: Degenerative facet spurring asymmetric to the right,
unchanged. Chronic small central disc protrusion without neural
compression. Mild right foraminal narrowing.

C5-6: Disc narrowing and bulging with a chronic central disc
protrusion extending to the left foramen and causing C6 impingement.
There is also right foraminal stenosis from uncovertebral ridging
and smaller protrusion. Mild ventral cord flattening that is
unchanged

C6-7: Small central disc protrusion.  No impingement

C7-T1:Mild right-sided facet spurring.  No impingement.
IMPRESSION: 1. Stable compared to 1554.
2. C5-6 disc protrusion with mild spinal stenosis and left more than
right foraminal impingement.
3. C4-5 focally advanced right facet osteoarthritis with mild right
foraminal narrowing.

## 2022-06-18 ENCOUNTER — Encounter: Payer: Self-pay | Admitting: *Deleted
